# Patient Record
Sex: Female | Born: 1965 | Race: Black or African American | Hispanic: No | Marital: Single | Smoking: Never smoker
Health system: Southern US, Community
[De-identification: ages and names within clinical notes are randomized; demographics above are authoritative.]

## PROBLEM LIST (undated history)

## (undated) DIAGNOSIS — E119 Type 2 diabetes mellitus without complications: Secondary | ICD-10-CM

## (undated) DIAGNOSIS — N809 Endometriosis, unspecified: Secondary | ICD-10-CM

## (undated) DIAGNOSIS — I10 Essential (primary) hypertension: Secondary | ICD-10-CM

## (undated) DIAGNOSIS — F32A Depression, unspecified: Secondary | ICD-10-CM

## (undated) DIAGNOSIS — F419 Anxiety disorder, unspecified: Secondary | ICD-10-CM

## (undated) DIAGNOSIS — Z8719 Personal history of other diseases of the digestive system: Secondary | ICD-10-CM

## (undated) DIAGNOSIS — K219 Gastro-esophageal reflux disease without esophagitis: Secondary | ICD-10-CM

---

## 2012-12-01 ENCOUNTER — Other Ambulatory Visit: Payer: Self-pay

## 2012-12-01 DIAGNOSIS — Z1231 Encounter for screening mammogram for malignant neoplasm of breast: Secondary | ICD-10-CM

## 2013-01-12 ENCOUNTER — Ambulatory Visit
Admission: RE | Admit: 2013-01-12 | Discharge: 2013-01-12 | Disposition: A | Discharge status: Home / Self Care | Payer: Medicaid Other | Source: Ambulatory Visit

## 2013-01-12 DIAGNOSIS — Z1231 Encounter for screening mammogram for malignant neoplasm of breast: Secondary | ICD-10-CM

## 2014-02-01 ENCOUNTER — Other Ambulatory Visit: Payer: Self-pay | Admitting: Family Medicine

## 2014-02-01 DIAGNOSIS — Z1231 Encounter for screening mammogram for malignant neoplasm of breast: Secondary | ICD-10-CM

## 2014-02-12 ENCOUNTER — Encounter (INDEPENDENT_AMBULATORY_CARE_PROVIDER_SITE_OTHER): Payer: Self-pay

## 2014-02-12 ENCOUNTER — Ambulatory Visit
Admission: RE | Admit: 2014-02-12 | Discharge: 2014-02-12 | Disposition: A | Discharge status: Home / Self Care | Payer: Medicaid Other | Source: Ambulatory Visit | Attending: Family Medicine | Admitting: Family Medicine

## 2014-02-12 DIAGNOSIS — Z1231 Encounter for screening mammogram for malignant neoplasm of breast: Secondary | ICD-10-CM

## 2014-03-24 ENCOUNTER — Emergency Department (INDEPENDENT_AMBULATORY_CARE_PROVIDER_SITE_OTHER)
Admission: EM | Admit: 2014-03-24 | Discharge: 2014-03-24 | Disposition: A | Discharge status: Home / Self Care | Payer: Medicaid Other | Source: Home / Self Care | Attending: Family Medicine | Admitting: Family Medicine

## 2014-03-24 ENCOUNTER — Encounter (HOSPITAL_COMMUNITY): Payer: Self-pay | Admitting: Emergency Medicine

## 2014-03-24 DIAGNOSIS — M7500 Adhesive capsulitis of unspecified shoulder: Secondary | ICD-10-CM

## 2014-03-24 DIAGNOSIS — E11618 Type 2 diabetes mellitus with other diabetic arthropathy: Secondary | ICD-10-CM

## 2014-03-24 DIAGNOSIS — E1169 Type 2 diabetes mellitus with other specified complication: Secondary | ICD-10-CM

## 2014-03-24 MED ORDER — PREDNISONE 5 MG PO KIT: PACK | ORAL | Status: DC

## 2014-03-24 NOTE — Discharge Instructions (Signed)
Thank you for coming in today. Take prednisone as directed for 12 days Monitor your blood sugar as prednisone will make it higher than normal.  Followup with Dr. Q[place]man Livings sports medicine.  Work on range of motion exercises that we discussed.   Adhesive Capsulitis Sometimes the shoulder becomes stiff and is painful to move. Some people say it feels as if the shoulder is frozen in place. Because of this, the condition is called "frozen shoulder." Its medical name is adhesive capsulitis.  The shoulder joint is made up of strong connective tissue that attaches the ball of the humerus to the shallow shoulder socket. This strong connective tissue is called the joint capsule. This tissue can become stiff and swollen. That is when adhesive capsulitis sets in. CAUSES  It is not always clear just what the cause adhesive capsulitis. Possibilities include:  Injury to the shoulder joint.  Strain. This is a repetitive injury brought about by overuse.  Lack of use. Perhaps your arm or hand was otherwise injured. It might have been in a sling for awhile. Or perhaps you were not using it to avoid pain.  Referred pain. This is a sort of trick the body plays. You feel pain in the shoulder. But, the pain actually comes from an injury somewhere else in the body.  Long-standing health problems. Several diseases can cause adhesive capsulitis. They include diabetes, heart disease, stroke, thyroid problems, rheumatoid arthritis and lung disease.  Being a women older than 40. Anyone can develop adhesive capsulitis but it is most common in women in this age group. SYMPTOMS   Pain.  It occurs when the arm is moved.  Parts of the shoulder might hurt if they are touched.  Pain is worse at night or when resting.  Soreness. It might not be strong enough to be called pain. But, the shoulder aches.  The shoulder does not move freely.  Muscle spasms.  Trouble sleeping because of shoulder ache or  pain. DIAGNOSIS  To decide if you have adhesive capsulitis, your healthcare provider will probably:  Ask about symptoms you have noticed.  Ask about your history of joint pain and anything that might have caused the pain.  Ask about your overall health.  Use hands to feel your shoulder and neck.  Ask you to move your shoulder in specific directions. This may indicate the origin of the pain.  Order imaging tests; pictures of the shoulder. They help pinpoint the source of the problem. An X-ray might be used. For more detail, an MRI is often used. An MRI details the tendons, muscles and ligaments as well as the joint. TREATMENT  Adhesive capsulitis can be treated several ways. Most treatments can be done in a clinic or in your healthcare provider's office. Be sure to discuss the different options with your caregiver. They include:  Physical therapy. You will work on specific exercises to get your shoulder moving again. The exercises usually involve stretching. A physical therapist (a caregiver with special training) can show you what to do and what not to do. The exercises will need to be done daily.  Medication.  Over-the-counter medicines may relieve pain and inflammation (the body's way of reacting to injury or infection).  Corticosteroids. These are stronger drugs to reduce pain and inflammation. They are given by injection (shots) into the shoulder joint. Frequent treatment is not recommended.  Muscle relaxants. Medication may be prescribed to ease muscle spasms.  Treatment of underlying conditions. This means treating another condition that  is causing your shoulder problem. This might be a rotator cuff (tendon) problem  Shoulder manipulation. The shoulder will be moved by your healthcare provider. You would be under general anesthesia (given a drug that puts you to sleep). You would not feel anything. Sometimes the joint will be injected with salt water (saline) at high pressure to  break down internal scarring in the joint capsule.  Surgery. This is rarely needed. It may be suggested in advanced cases after all other treatment has failed. PROGNOSIS  In time, most people recover from adhesive capsulitis. Sometimes, however, the pain goes away but full movement of the shoulder does not return.  HOME CARE INSTRUCTIONS   Take any pain medications recommended by your healthcare provider. Follow the directions carefully.  If you have physical therapy, follow through with the therapist's suggestions. Be sure you understand the exercises you will be doing. You should understand:  How often the exercises should be done.  How many times each exercise should be repeated.  How long they should be done.  What other activities you should do, or not do.  That you should warm up before doing any exercise. Just 5 to 10 minutes will help. Small, gentle movements should get your shoulder ready for more.  Avoid high-demand exercise that involves your shoulder such as throwing. This type of exercise can make pain worse.  Consider using cold packs. Cold may ease swelling and pain. Ask your healthcare provider if a cold pack might help you. If so, get directions on how and when to use them. SEEK MEDICAL CARE IF:   You have any questions about your medications.  Your pain continues to increase. Document Released: 04/18/2009 Document Revised: 09/13/2011 Document Reviewed: 04/18/2009 [facility] Patient Information 2015 [place], [place]. This information is not intended to replace advice given to you by your health care provider. Make sure you discuss any questions you have with your health care provider.

## 2014-03-24 NOTE — ED Notes (Signed)
Patient complains of right shoulder pain Denies any injury Woke up with the pain three days ago

## 2014-03-24 NOTE — ED Provider Notes (Signed)
Olivia Campbell is a 48 y.o. female who presents to Urgent Care today for right shoulder pain. Patient is a three-day history of right shoulder pain occurred without injury. The pain is moderate to severe is located in the anterior lateral aspect of the shoulder. The pain is worse with overhand motion and external rotation. Patient notes that she is unable to fully abduct or externally rotate her arm. She's tried ibuprofen at over-the-counter and prescription doses which have not helped. The pain is interfering with sleep. She has a medical history significant for diabetes. She is allergic to Novocain.   History reviewed. No pertinent past medical history. History  Substance Use Topics  . Smoking status: Not on file  . Smokeless tobacco: Not on file  . Alcohol Use: Not on file   ROS as above Medications: No current facility-administered medications for this encounter.   Current Outpatient Prescriptions  Medication Sig Dispense Refill  . PredniSONE 5 MG KIT 12 day dosepack po  1 kit  0    Exam:  BP 125/86  Pulse 87  Temp(Src) 98.1 F (36.7 C) (Oral)  Resp 16  SpO2 97%  LMP 12/16/2012 Gen: Well NAD HEENT: ,  MMM Lungs: Normal work of breathing. CTABL Heart: RRR no MRG Exts: Brisk capillary refill, warm and well perfused.  Neck: Nontender to spinal midline normal neck range of motion negative Spurling's test Right shoulder: Normal-appearing nontender. External rotation limited to 10 beyond neutral position. Abduction limited to 70 active and passive Forward flexion limited to 80 active and passive Unable to adequately perform impingement testing due to guarding and limited range of motion. Upper extremity grip strength sensation and capillary Refill and pulses are intact equal bilaterally Reflexes are equal bilateral elbows and brachial radialis  No results found for this or any previous visit (from the past 24 hour(s)). No results found.  Assessment and Plan: 48 y.o.  female with right shoulder pain. Likely early 55s of capsulitis. Suspect either capsulitis instead of bursitis given limited range of motion in more than one plain. Discussed options. Patient is allergic to lidocaine therefore feel that course of steroid injection is not a great idea at this time. We discussed options and plan for a plan of oral prednisone with gentle home range of motion. Followup with sports medicine.  Discussed warning signs or symptoms. Please see discharge instructions. Patient expresses understanding.     Gregor Hams, MD 03/24/14 1055

## 2016-01-20 ENCOUNTER — Other Ambulatory Visit: Payer: Self-pay | Admitting: Family Medicine

## 2016-01-20 DIAGNOSIS — Z1231 Encounter for screening mammogram for malignant neoplasm of breast: Secondary | ICD-10-CM

## 2016-01-29 ENCOUNTER — Ambulatory Visit
Admission: RE | Admit: 2016-01-29 | Discharge: 2016-01-29 | Disposition: A | Discharge status: Home / Self Care | Payer: Medicaid Other | Source: Ambulatory Visit | Attending: Family Medicine | Admitting: Family Medicine

## 2016-01-29 DIAGNOSIS — Z1231 Encounter for screening mammogram for malignant neoplasm of breast: Secondary | ICD-10-CM

## 2016-01-29 IMAGING — MG DIGITAL SCREENING BILATERAL MAMMOGRAM WITH CAD
8 series · 8 of 8 positions shown · non-contrast
Comparison: Previous exam(s).

CLINICAL DATA: Screening.

EXAM:
DIGITAL SCREENING BILATERAL MAMMOGRAM WITH CAD

[L CC (1 of 2)]
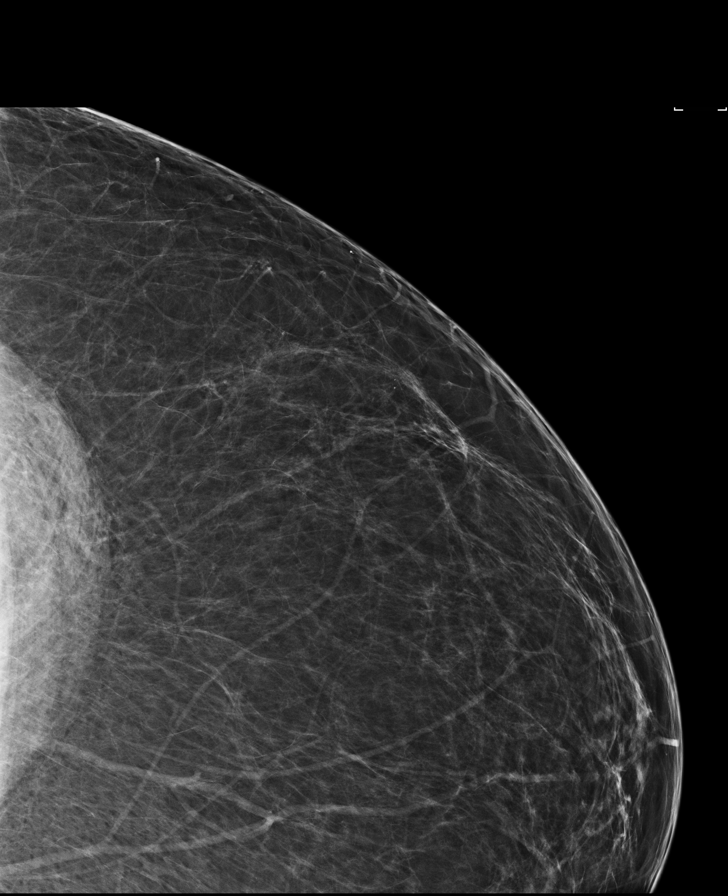

[L MLO (1 of 2)]
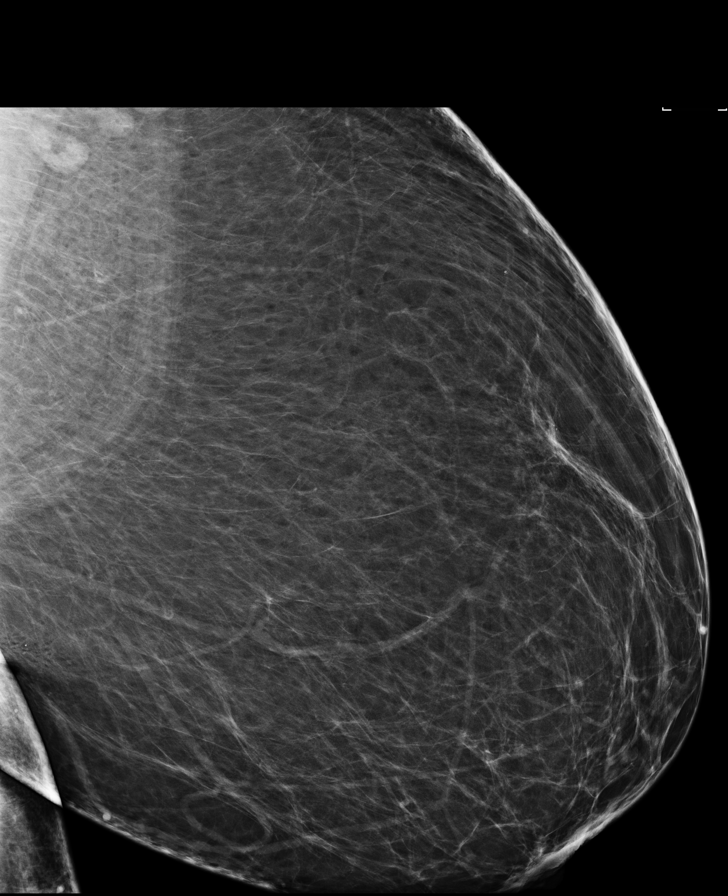

[L MLO (2 of 2)]
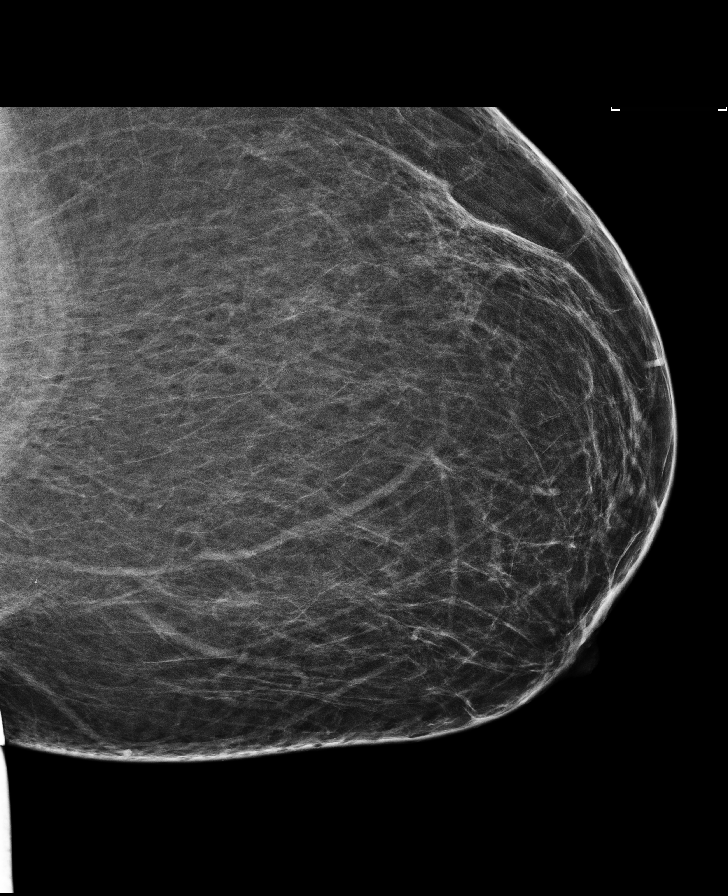

[L CC (2 of 2)]
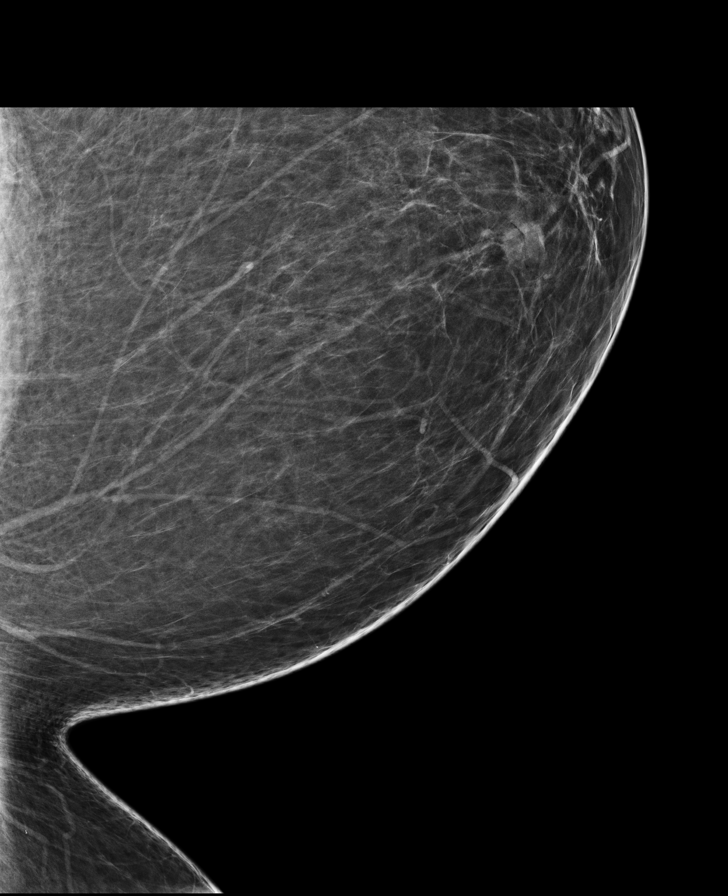

[R CC (1 of 2)]
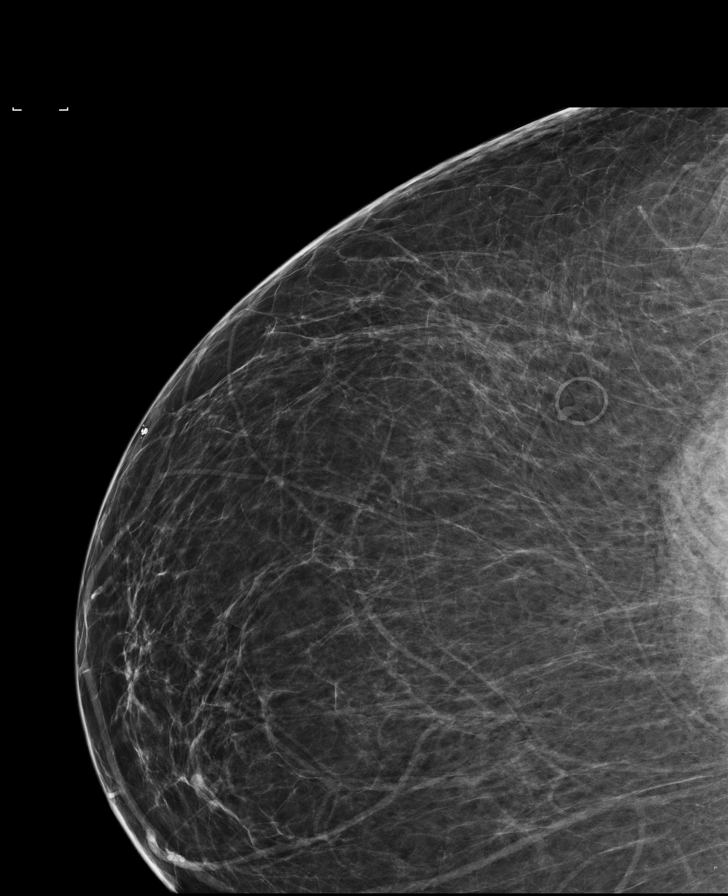

[R MLO (1 of 2)]
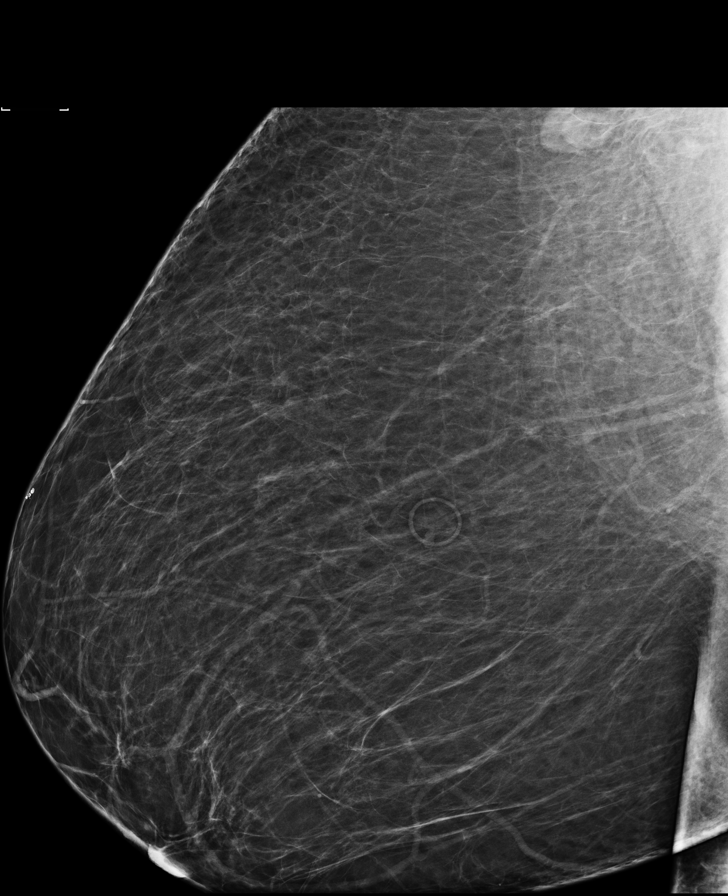

[R CC (2 of 2)]
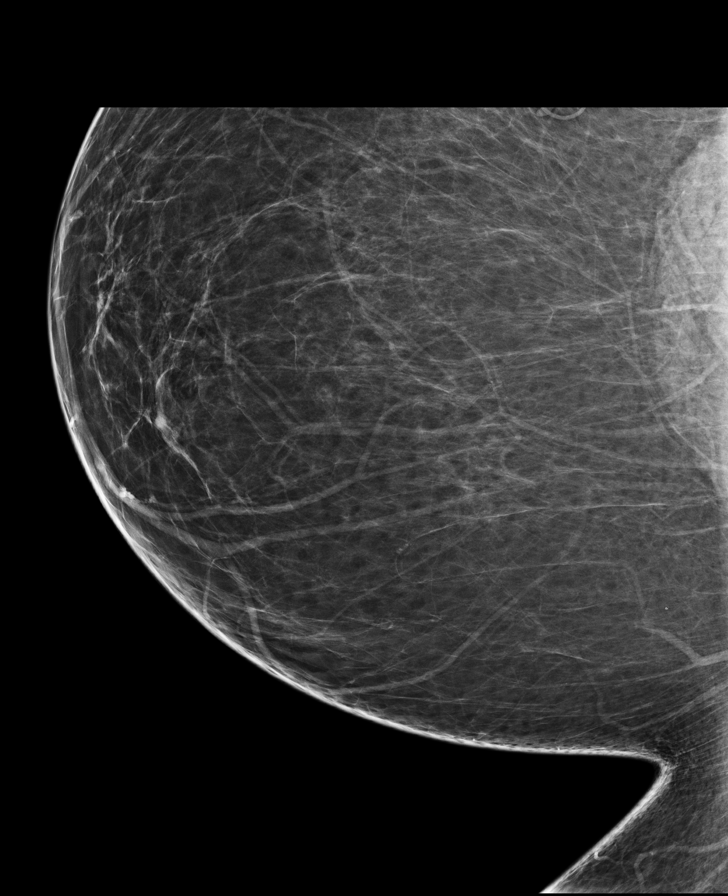

[R MLO (2 of 2)]
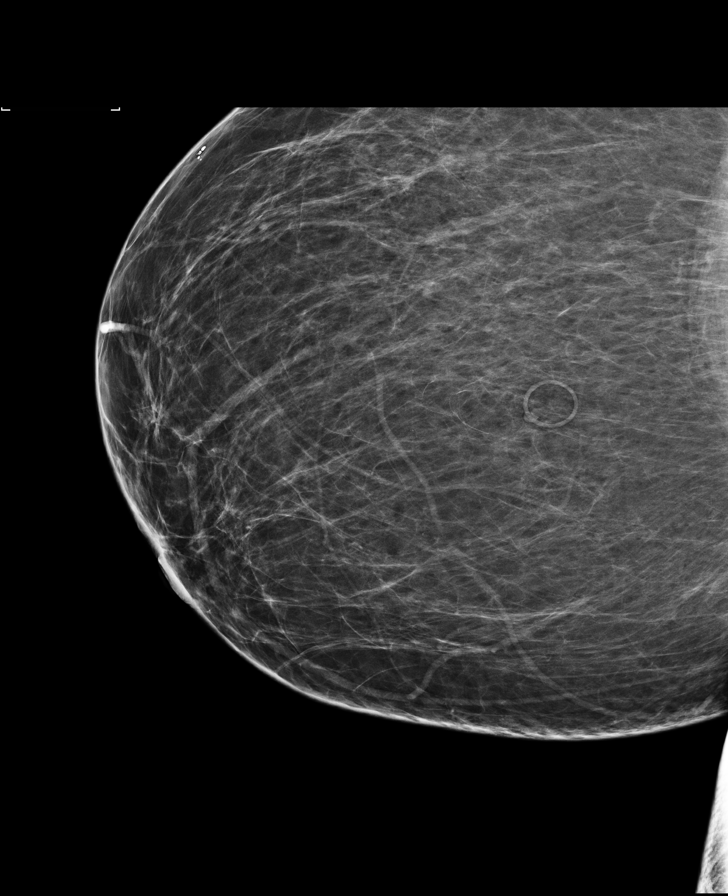

[8 of 8 positions shown; findings below may reference images not displayed]

ACR Breast Density Category b: There are scattered areas of
fibroglandular density.
FINDINGS: There are no findings suspicious for malignancy. Images were
processed with CAD.
IMPRESSION: No mammographic evidence of malignancy. A result letter of this
screening mammogram will be mailed directly to the patient.

RECOMMENDATION:
Screening mammogram in one year. (Code:[US])

BI-RADS CATEGORY  1: Negative.

## 2016-11-27 ENCOUNTER — Emergency Department (HOSPITAL_BASED_OUTPATIENT_CLINIC_OR_DEPARTMENT_OTHER)
Admission: EM | Admit: 2016-11-27 | Discharge: 2016-11-28 | Disposition: A | Discharge status: Home / Self Care | Payer: Medicaid Other | Attending: Emergency Medicine | Admitting: Emergency Medicine

## 2016-11-27 ENCOUNTER — Encounter (HOSPITAL_BASED_OUTPATIENT_CLINIC_OR_DEPARTMENT_OTHER): Payer: Self-pay | Admitting: Emergency Medicine

## 2016-11-27 ENCOUNTER — Emergency Department (HOSPITAL_BASED_OUTPATIENT_CLINIC_OR_DEPARTMENT_OTHER): Payer: Medicaid Other

## 2016-11-27 DIAGNOSIS — T783XXA Angioneurotic edema, initial encounter: Secondary | ICD-10-CM | POA: Insufficient documentation

## 2016-11-27 DIAGNOSIS — E119 Type 2 diabetes mellitus without complications: Secondary | ICD-10-CM | POA: Insufficient documentation

## 2016-11-27 DIAGNOSIS — I10 Essential (primary) hypertension: Secondary | ICD-10-CM | POA: Insufficient documentation

## 2016-11-27 DIAGNOSIS — Z79899 Other long term (current) drug therapy: Secondary | ICD-10-CM | POA: Diagnosis not present

## 2016-11-27 DIAGNOSIS — Z7984 Long term (current) use of oral hypoglycemic drugs: Secondary | ICD-10-CM | POA: Diagnosis not present

## 2016-11-27 DIAGNOSIS — T7840XA Allergy, unspecified, initial encounter: Secondary | ICD-10-CM | POA: Diagnosis present

## 2016-11-27 HISTORY — DX: Type 2 diabetes mellitus without complications: E11.9

## 2016-11-27 HISTORY — DX: Essential (primary) hypertension: I10

## 2016-11-27 IMAGING — CT CT NECK W/O CM
3 of 9 series · 7 of 33 positions shown, 8 images · non-contrast
Comparison: None.

CLINICAL DATA: Tongue and pharyngeal swelling for 9 hours,
dysphagia. Assess for parotiditis. History of hypertension and
diabetes.

EXAM:
CT NECK WITHOUT CONTRAST
TECHNIQUE: Multidetector CT imaging of the neck was performed following the
standard protocol without intravenous contrast.

[Series 3: axial neck · axial · 0.47mm/px · z∈[-199,-127]mm · 2 of 110 slices shown]
[im 37/110  bone]
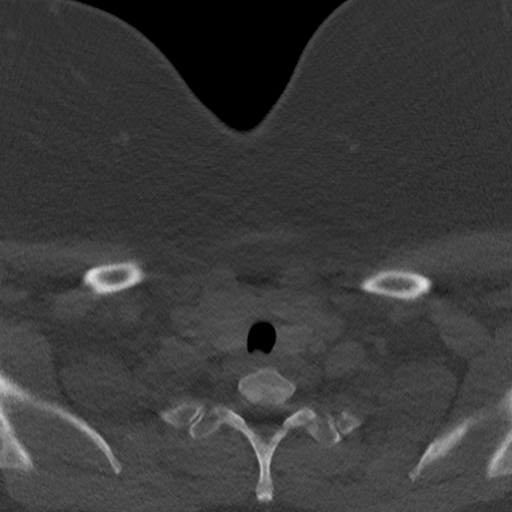
[im 73/110  bone]
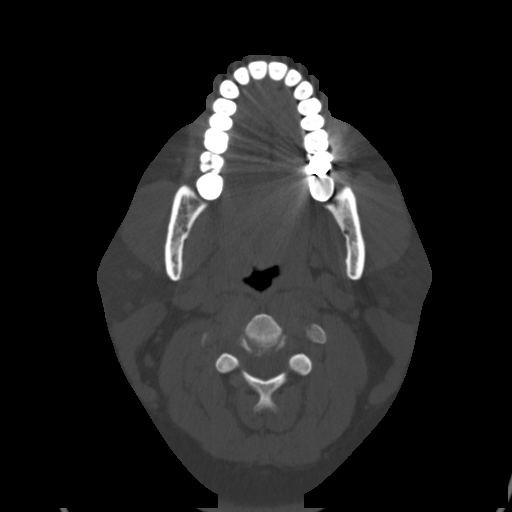

[Series 6: cor neck · coronal · 0.47mm/px · 1 of 115 slices shown]
[im 58/115  bone]
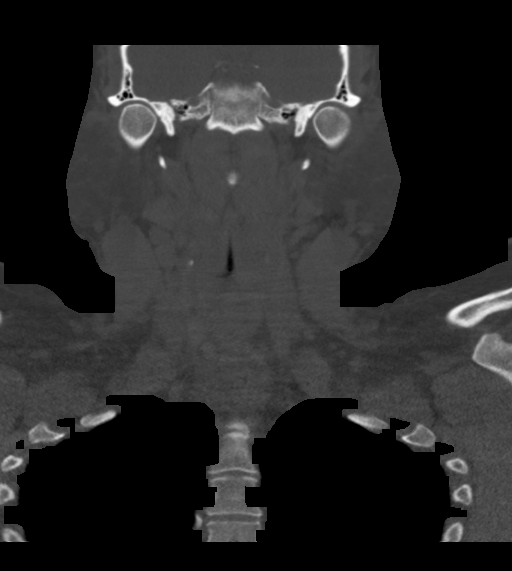

[Series 7: orthogonal ax · axial · 0.39mm/px · z∈[-271,-116]mm · 4 of 138 slices shown, 5 images]
[im 28/138  soft-tissue]
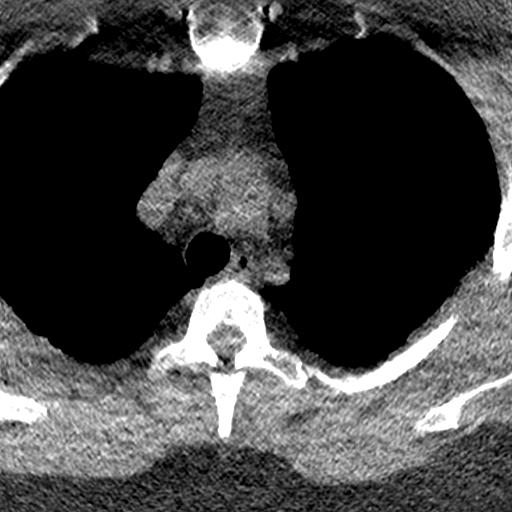
[im 28/138  bone]
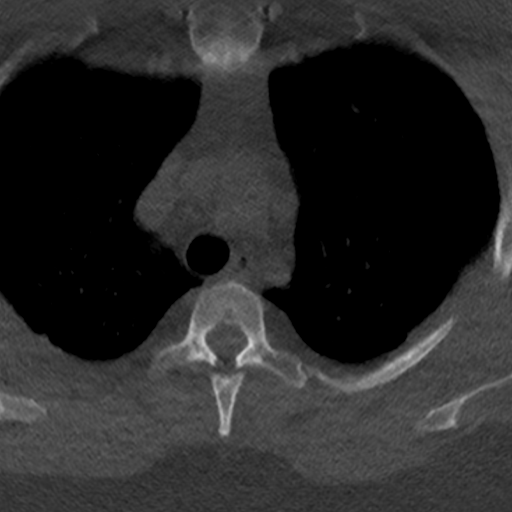
[im 55/138  bone]
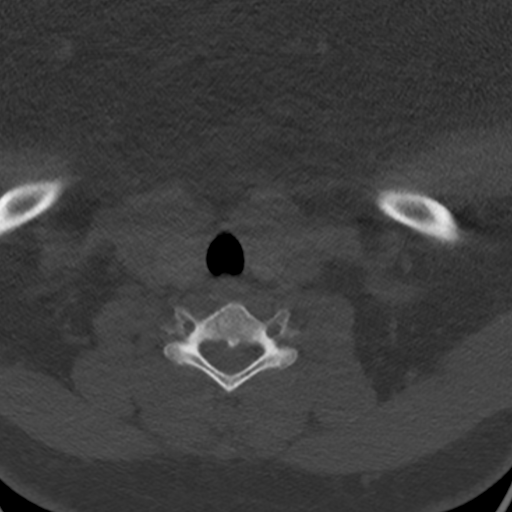
[im 83/138  bone]
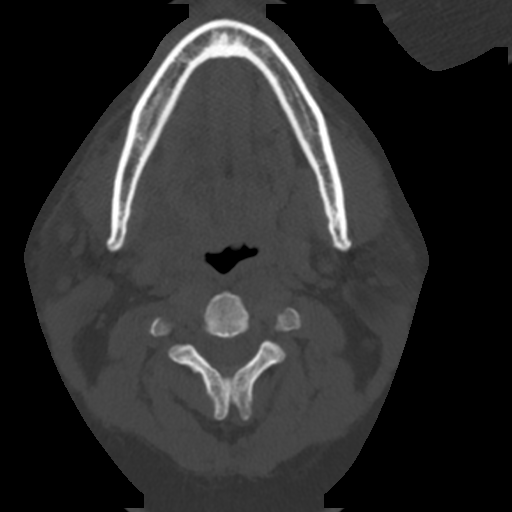
[im 110/138  bone]
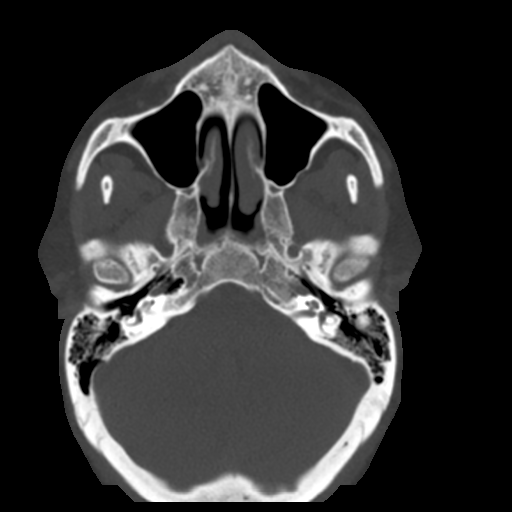

[7 of 33 positions shown; findings below may reference images not displayed]

FINDINGS: Large body habitus results in overall noisy image quality.

PHARYNX AND LARYNX: RIGHT lateral pharyngeal space fat stranding and
effacement. Widely patent airway.

SALIVARY GLANDS: Fat stranding within RIGHT submandibular space,
trace effusion. No sialolith. Normal noncontrast CT appearance of
parotid glands. Subcentimeter bilateral parotid gland lymph nodes
are likely reactive.

THYROID: Normal.

LYMPH NODES: No lymphadenopathy by CT size criteria though limited
assessment without contrast.

VASCULAR: Normal.

LIMITED INTRACRANIAL: Normal.

VISUALIZED ORBITS: Normal.

MASTOIDS AND VISUALIZED PARANASAL SINUSES: Well-aerated.

SKELETON: Nonacute.  No CT findings of dental pathology

UPPER CHEST: Lung apices are clear. No superior mediastinal
lymphadenopathy.

OTHER: None.
IMPRESSION: Limited noncontrast CT neck: Acute suspected RIGHT submandibular
sialoadenitis without sialolith. Patent airway.

## 2016-11-27 MED ORDER — FAMOTIDINE IN NACL 20-0.9 MG/50ML-% IV SOLN
20.0000 mg | Freq: Once | INTRAVENOUS | Status: AC
  Administered 2016-11-27: 20 mg via INTRAVENOUS

## 2016-11-27 MED ORDER — DIPHENHYDRAMINE HCL 50 MG/ML IJ SOLN
INTRAMUSCULAR | Status: AC
  Administered 2016-11-27: 25 mg via INTRAVENOUS
  Filled 2016-11-27: qty 1

## 2016-11-27 MED ORDER — DIPHENHYDRAMINE HCL 50 MG/ML IJ SOLN
25.0000 mg | Freq: Once | INTRAMUSCULAR | Status: AC
  Administered 2016-11-27: 25 mg via INTRAVENOUS

## 2016-11-27 MED ORDER — PREDNISONE 20 MG PO TABS: 40.0000 mg | ORAL_TABLET | Freq: Every day | ORAL | 0 refills | Status: DC

## 2016-11-27 MED ORDER — FAMOTIDINE IN NACL 20-0.9 MG/50ML-% IV SOLN
INTRAVENOUS | Status: AC
  Administered 2016-11-27: 20 mg via INTRAVENOUS
  Filled 2016-11-27: qty 50

## 2016-11-27 MED ORDER — METHYLPREDNISOLONE SODIUM SUCC 125 MG IJ SOLR
INTRAMUSCULAR | Status: AC
  Administered 2016-11-27: 125 mg via INTRAVENOUS
  Filled 2016-11-27: qty 2

## 2016-11-27 MED ORDER — METHYLPREDNISOLONE SODIUM SUCC 125 MG IJ SOLR
125.0000 mg | Freq: Once | INTRAMUSCULAR | Status: AC
  Administered 2016-11-27: 125 mg via INTRAVENOUS

## 2016-11-27 NOTE — ED Notes (Signed)
Pt has swelling to tongue and posterior pharynx that started around 15:00 today. Swelling has progressively worsened. Pt reports difficulty swallowing, and her throat "feels like it has rocks in it." Pt denies any difficulty with breathing at this time. Pt takes lisinopril.

## 2016-11-27 NOTE — ED Provider Notes (Signed)
[facility] Provider Note   CSN: [id number] Arrival date & time: 11/27/16  2134 By signing my name below, I, Fabian Sharp, attest that this documentation has been prepared under the direction and in the presence of Blanchie Dessert, MD . Electronically Signed: Fabian Sharp, ED Scribe. 11/27/2016. 10:10 PM.  History   Chief Complaint Chief Complaint  Patient presents with  . Allergic Reaction  HPI Comments:  Olivia Campbell is a 51 y.o. female, with a history of DM and HTN on Lisinopril, who presents to the Emergency Department complaining of sudden onset, progressively worsening swelling to her tongue and posterior pharynx onset today at 3 pm. Pt reports associated right sided facial numbness and difficulty swallowing. Per pt, her symptoms worsened significantly tonight at 6 pm after eating, and have continued to intensify. No OTC treatments tried for these symptoms PTA.  Pt's current symptoms do not feel like prior allergic reaction to shellfish.  Pt denies shortness of breath, rash, itching, or sore throat.   The history is provided by the patient. No language interpreter was used.   Past Medical History:  Diagnosis Date  . Diabetes mellitus without complication ([place])   . Hypertension     There are no active problems to display for this patient.   History reviewed. No pertinent surgical history.  OB History    No data available       Home Medications    Prior to Admission medications   Medication Sig Start Date End Date Taking? Authorizing Provider  buPROPion (WELLBUTRIN) 100 MG tablet Take 100 mg by mouth 2 (two) times daily.   Yes [provider]  esomeprazole (NEXIUM) 20 MG capsule Take 20 mg by mouth daily at 12 noon.   Yes [provider]  FLUoxetine (PROZAC) 20 MG tablet Take 20 mg by mouth daily.   Yes [provider]  lisinopril (PRINIVIL,ZESTRIL) 10 MG tablet Take 10 mg by mouth daily.   Yes [provider]    sitaGLIPtin-metformin (JANUMET) 50-1000 MG tablet Take 1 tablet by mouth 2 (two) times daily with a meal.   Yes [provider]  PredniSONE 5 MG KIT 12 day dosepack po 03/24/14   Gregor Hams, MD    Family History No family history on file.  Social History Social History  Substance Use Topics  . Smoking status: Not on file  . Smokeless tobacco: Not on file  . Alcohol use Not on file     Allergies   Novocain [procaine]   Review of Systems Review of Systems All systems reviewed and are negative for acute change except as noted in the HPI.  Physical Exam Updated Vital Signs BP 124/80 (BP Location: Right Arm)   Pulse 74   Temp 98.6 F (37 C) (Oral)   Resp 20   LMP 12/16/2012   SpO2 100%   Physical Exam  Constitutional: She appears well-developed and well-nourished. No distress.  HENT:  Head: Normocephalic and atraumatic.  Fullness and swelling in right parotid and angle of the jaw. Minimal tenderness, no swelling of the uvula. Minimal right sided tongue swelling. No voice changes.  Eyes: Conjunctivae are normal.  Neck: Normal range of motion. No tracheal deviation present.  No bruits  Cardiovascular: Normal rate, regular rhythm and normal heart sounds.   Pulmonary/Chest: Effort normal and breath sounds normal. No stridor. No respiratory distress. She has no wheezes. She has no rales.  Abdominal: She exhibits no distension.  Musculoskeletal: Normal range of motion.  Neurological: She is alert.  Skin: No pallor.  Psychiatric: She has a normal mood and affect. Her behavior is normal.  Nursing note and vitals reviewed.  ED Treatments / Results  DIAGNOSTIC STUDIES:  Oxygen Saturation is 100% on RA, normal by my interpretation.    COORDINATION OF CARE:  10:14 PM Will order benadryl, solumedrol and Pepcid.  Discussed treatment plan with pt at bedside and pt agreed to plan.  Labs (all labs ordered are listed, but only abnormal results are displayed) Labs  Reviewed - No data to display  EKG  EKG Interpretation None       Radiology Ct Soft Tissue Neck Wo Contrast  Result Date: 11/27/2016 CLINICAL DATA:  Tongue and pharyngeal swelling for 9 hours, dysphagia. Assess for parotiditis. History of hypertension and diabetes. EXAM: CT NECK WITHOUT CONTRAST TECHNIQUE: Multidetector CT imaging of the neck was performed following the standard protocol without intravenous contrast. COMPARISON:  None. FINDINGS: Large body habitus results in overall noisy image quality. PHARYNX AND LARYNX: RIGHT lateral pharyngeal space fat stranding and effacement. Widely patent airway. SALIVARY GLANDS: Fat stranding within RIGHT submandibular space, trace effusion. No sialolith. Normal noncontrast CT appearance of parotid glands. Subcentimeter bilateral parotid gland lymph nodes are likely reactive. THYROID: Normal. LYMPH NODES: No lymphadenopathy by CT size criteria though limited assessment without contrast. VASCULAR: Normal. LIMITED INTRACRANIAL: Normal. VISUALIZED ORBITS: Normal. MASTOIDS AND VISUALIZED PARANASAL SINUSES: Well-aerated. SKELETON: Nonacute.  No CT findings of dental pathology UPPER CHEST: Lung apices are clear. No superior mediastinal lymphadenopathy. OTHER: None. IMPRESSION: Limited noncontrast CT neck: Acute suspected RIGHT submandibular sialoadenitis without sialolith. Patent airway. Electronically Signed   By: Elon Alas M.D.   On: 11/27/2016 23:46    Procedures Procedures (including critical care time)  Medications Ordered in ED Medications  methylPREDNISolone sodium succinate (SOLU-MEDROL) 125 mg/2 mL injection (not administered)  diphenhydrAMINE (BENADRYL) 50 MG/ML injection (not administered)  famotidine (PEPCID) 20-0.9 MG/50ML-% IVPB (not administered)     Initial Impression / Assessment and Plan / ED Course  I have reviewed the triage vital signs and the nursing notes.  Pertinent labs & imaging results that were available during my  care of the patient were reviewed by me and considered in my medical decision making (see chart for details).     Patient with worsening swelling in the right side of the tongue and face with some mild difficulty swallowing. This has been gradually worsening since around 3:00 today. Patient is in no acute distress and is tolerating her own secretions but she feels that is difficult to swallow. She denies any breathing difficulty. Minimal swelling to the right side of the tongue but no uvular swelling or deviation. No tracheal deviation. Patient is on lisinopril and suspect symptoms are related to angioedema however there is some minimal tenderness over the parotid and will do a CT to rule out any other acute process. She denies any infectious symptoms and is otherwise well-appearing.  Patient did receive Benadryl, Decadron and Pepcid. Will continue to monitor the patient  11:56 PM CT with swelling around the submandibular gland but no airway involvement or stones.  On re-eval pt is feeling much better.  Will d/c home.  To stop lisinopril.  Final Clinical Impressions(s) / ED Diagnoses   Final diagnoses:  Angioedema, initial encounter    New Prescriptions New Prescriptions   PREDNISONE (DELTASONE) 20 MG TABLET    Take 2 tablets (40 mg total) by mouth daily.   I personally performed the services described in  this documentation, which was scribed in my presence.  The recorded information has been reviewed and considered.     Blanchie Dessert, MD 11/28/16 0000

## 2016-11-27 NOTE — ED Notes (Signed)
EDP at bedside  

## 2016-11-27 NOTE — ED Triage Notes (Signed)
PT presents to ED with complaints of facial swelling and throat swelling that started around 3 pm today. PT states she took her lisinopril around 9 am

## 2016-11-27 NOTE — ED Notes (Signed)
EDP notified. 

## 2016-11-28 NOTE — ED Notes (Signed)
ED Provider at bedside. 

## 2016-11-28 NOTE — ED Notes (Signed)
Pt reports subjective improvement of oral swelling. Mild swelling still noted of the tongue. Pt provided with strict return instructions if pt notices a worsening of symptoms.

## 2016-12-23 ENCOUNTER — Other Ambulatory Visit: Payer: Self-pay | Admitting: Family Medicine

## 2016-12-23 DIAGNOSIS — Z1231 Encounter for screening mammogram for malignant neoplasm of breast: Secondary | ICD-10-CM

## 2017-02-07 ENCOUNTER — Ambulatory Visit
Admission: RE | Admit: 2017-02-07 | Discharge: 2017-02-07 | Disposition: A | Discharge status: Home / Self Care | Payer: Medicaid Other | Source: Ambulatory Visit | Attending: Family Medicine | Admitting: Family Medicine

## 2017-02-07 DIAGNOSIS — Z1231 Encounter for screening mammogram for malignant neoplasm of breast: Secondary | ICD-10-CM

## 2017-02-07 IMAGING — MG DIGITAL SCREENING BILATERAL MAMMOGRAM WITH CAD
8 of 10 series · 8 of 10 positions shown · non-contrast
Comparison: Previous exam(s).

ACR Breast Density Category a: The breast tissue is almost entirely
fatty.

CLINICAL DATA: Screening.

EXAM:
DIGITAL SCREENING BILATERAL MAMMOGRAM WITH CAD

[R MLO (1 of 2)]
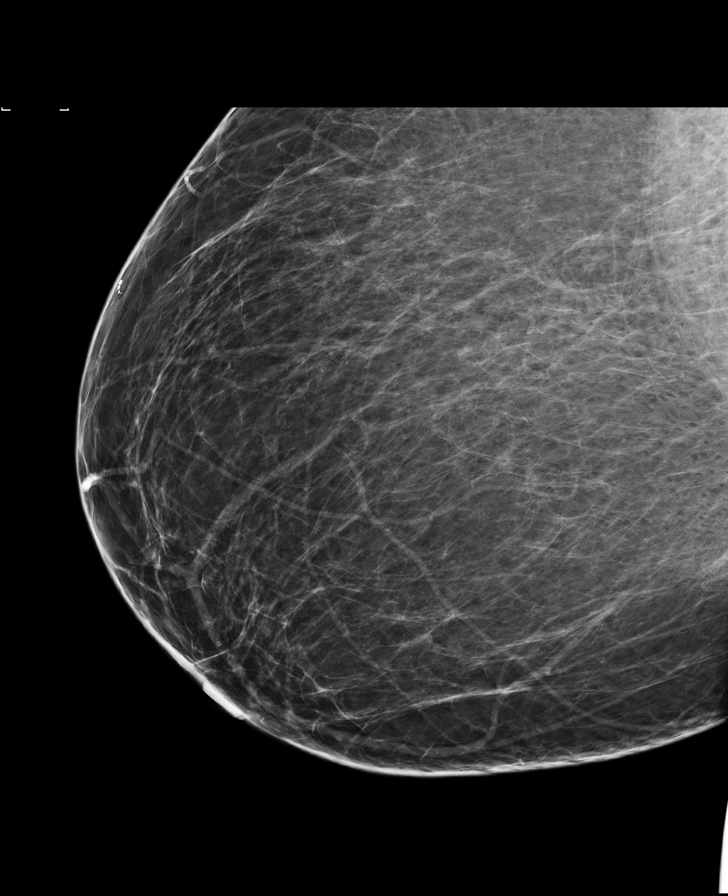

[L CC]
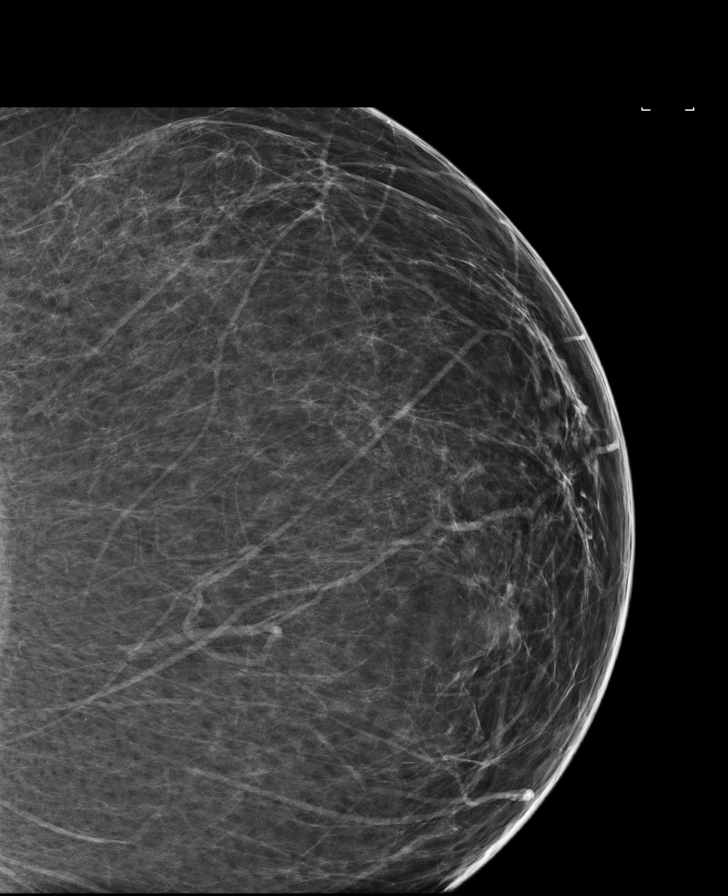

[R CC (1 of 2)]
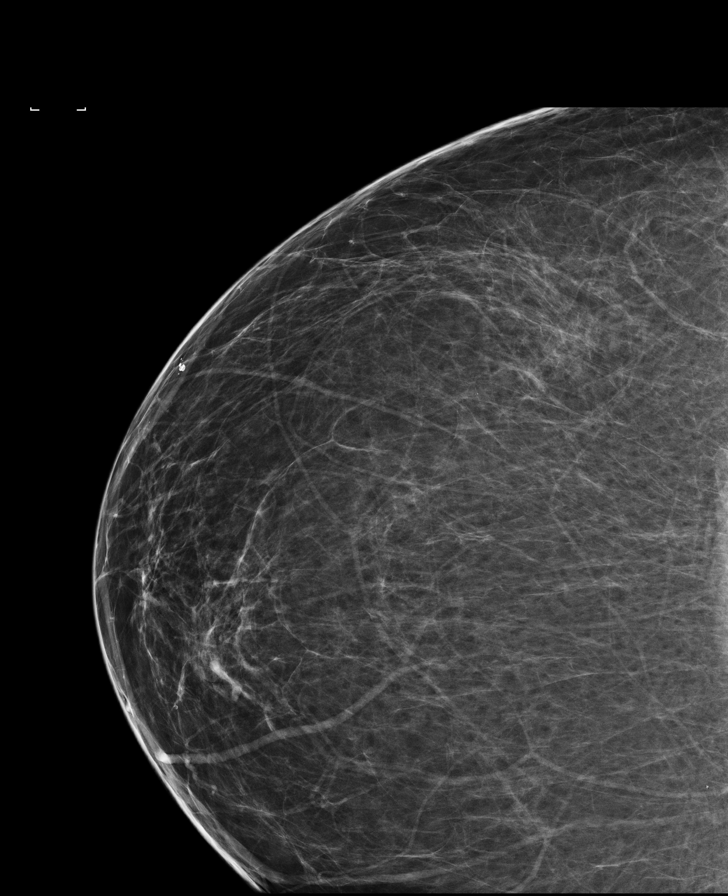

[R CC (2 of 2)]
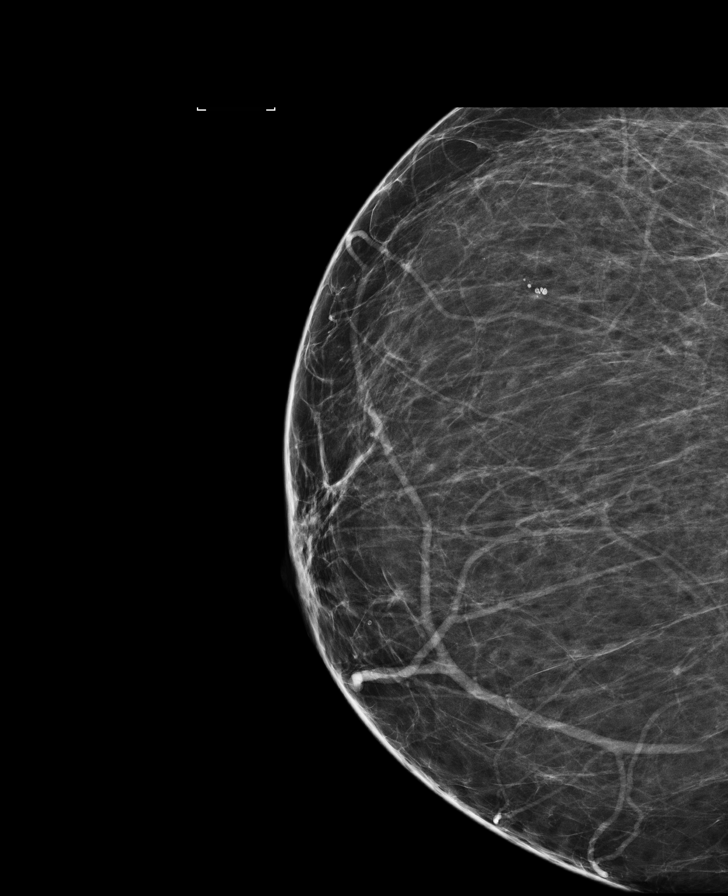

[L CV]
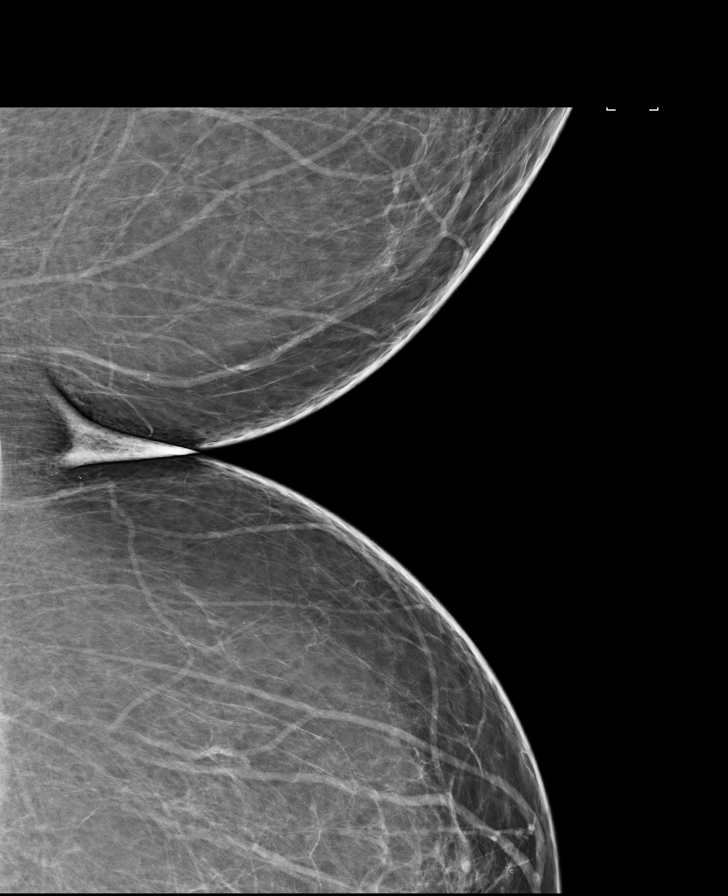

[L MLO (1 of 2)]
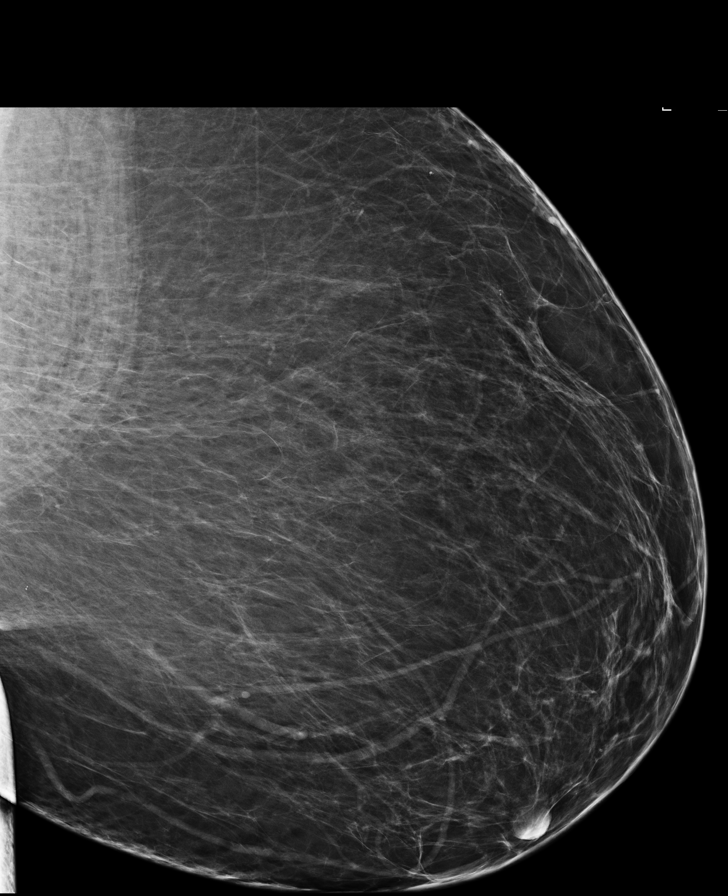

[L MLO (2 of 2)]
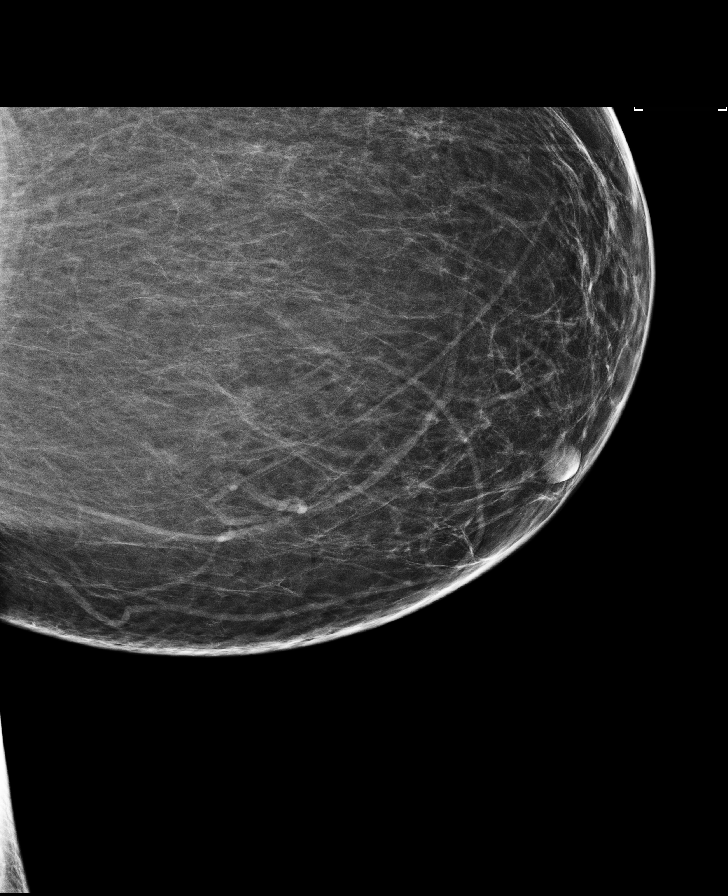

[R MLO (2 of 2)]
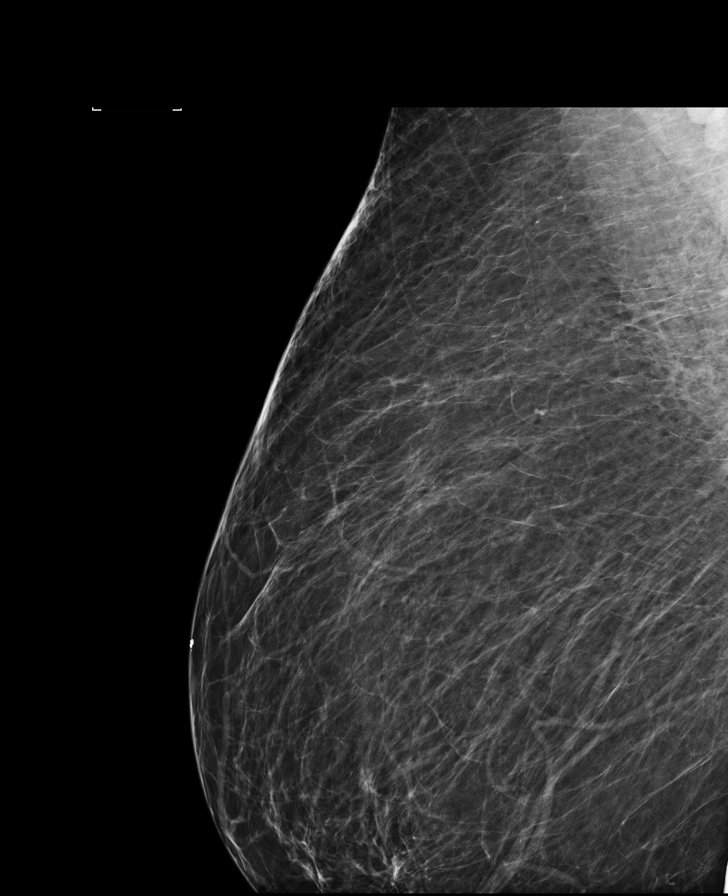

[8 of 10 positions shown; findings below may reference images not displayed]

FINDINGS: There are no findings suspicious for malignancy. Images were
processed with CAD.
IMPRESSION: No mammographic evidence of malignancy. A result letter of this
screening mammogram will be mailed directly to the patient.

RECOMMENDATION:
Screening mammogram in one year. (Code:[0V])

BI-RADS CATEGORY  1: Negative.

## 2018-01-20 ENCOUNTER — Other Ambulatory Visit: Payer: Self-pay | Admitting: Physician Assistant

## 2018-01-20 ENCOUNTER — Other Ambulatory Visit: Payer: Self-pay | Admitting: Obstetrics and Gynecology

## 2018-01-20 DIAGNOSIS — Z1231 Encounter for screening mammogram for malignant neoplasm of breast: Secondary | ICD-10-CM

## 2018-02-15 ENCOUNTER — Ambulatory Visit
Admission: RE | Admit: 2018-02-15 | Discharge: 2018-02-15 | Disposition: A | Discharge status: Home / Self Care | Payer: Medicaid Other | Source: Ambulatory Visit | Attending: Physician Assistant | Admitting: Physician Assistant

## 2018-02-15 DIAGNOSIS — Z1231 Encounter for screening mammogram for malignant neoplasm of breast: Secondary | ICD-10-CM

## 2018-02-15 IMAGING — MG DIGITAL SCREENING BILATERAL MAMMOGRAM WITH TOMO AND CAD
6 of 12 series · 6 of 36 positions shown · non-contrast
Comparison: Previous exam(s).

CLINICAL DATA: Screening.

EXAM:
DIGITAL SCREENING BILATERAL MAMMOGRAM WITH TOMO AND CAD

[L CC synth-2D (1 of 2)]
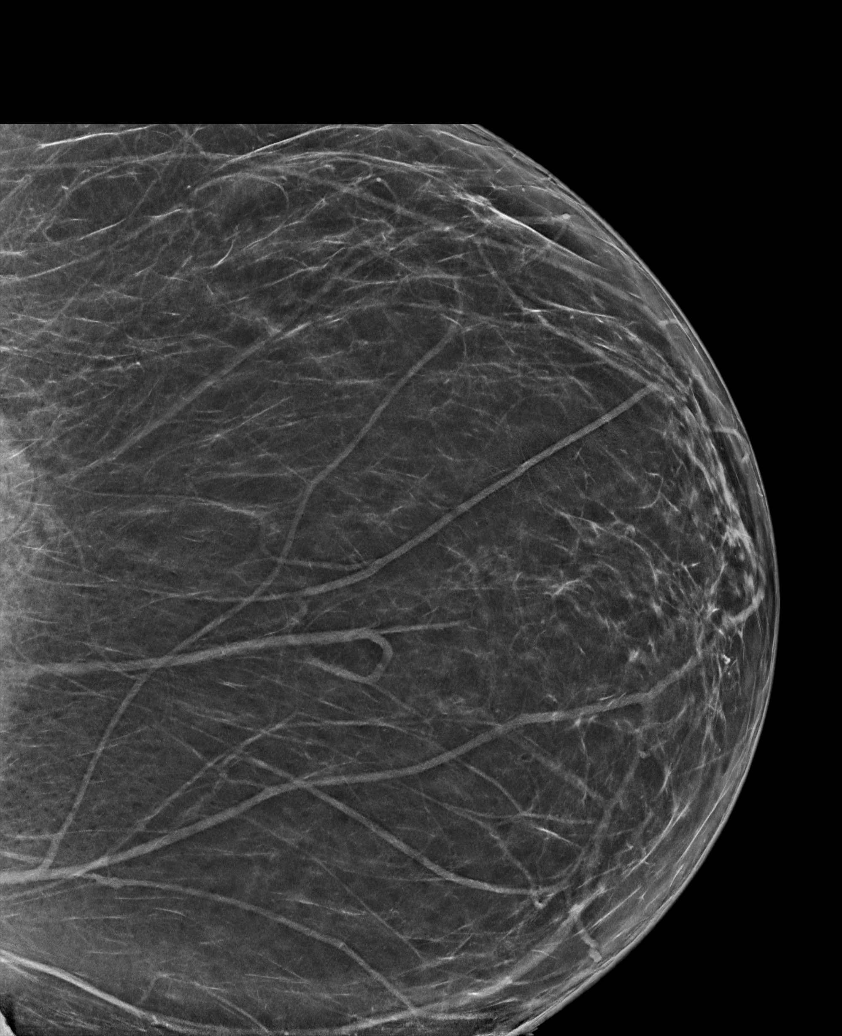

[R MLO synth-2D]
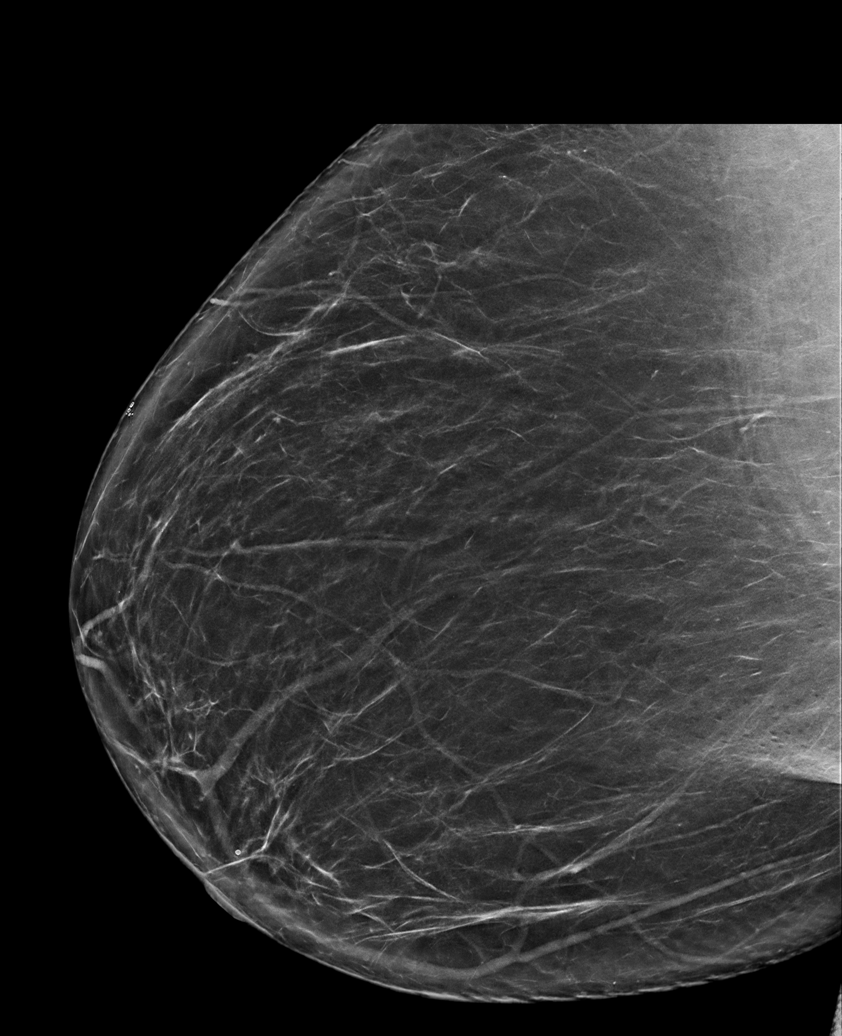

[L CC synth-2D (2 of 2)]
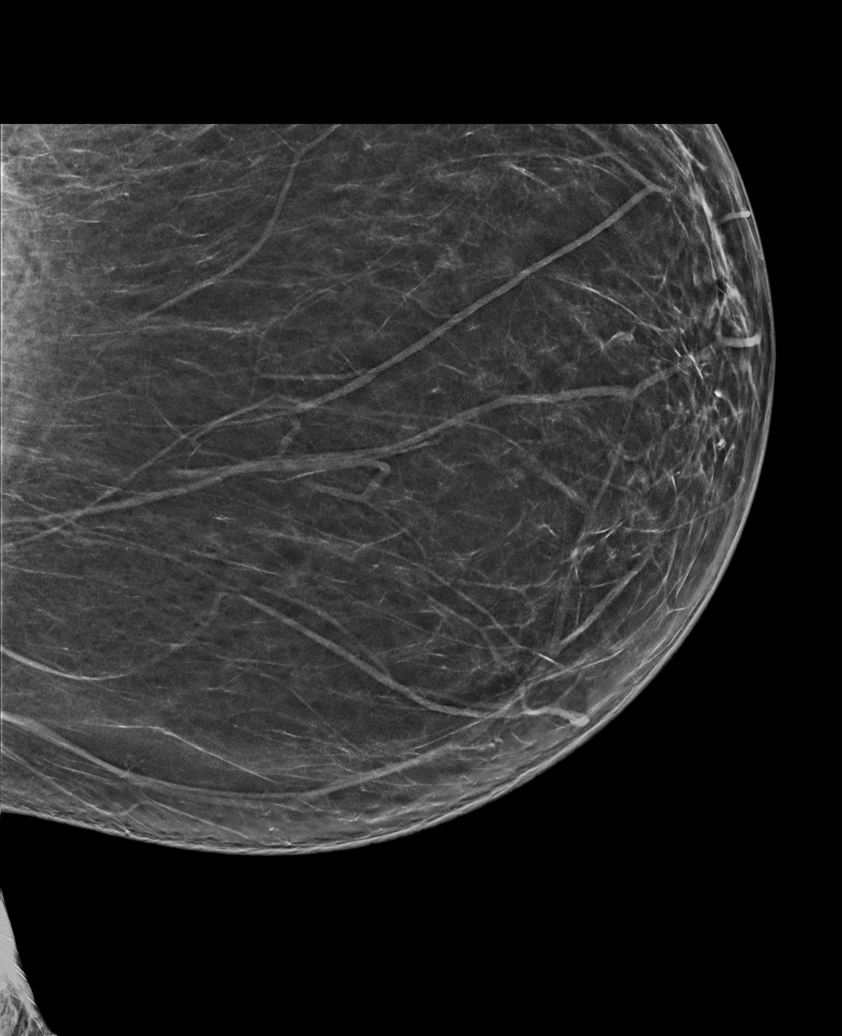

[R CC synth-2D (1 of 2)]
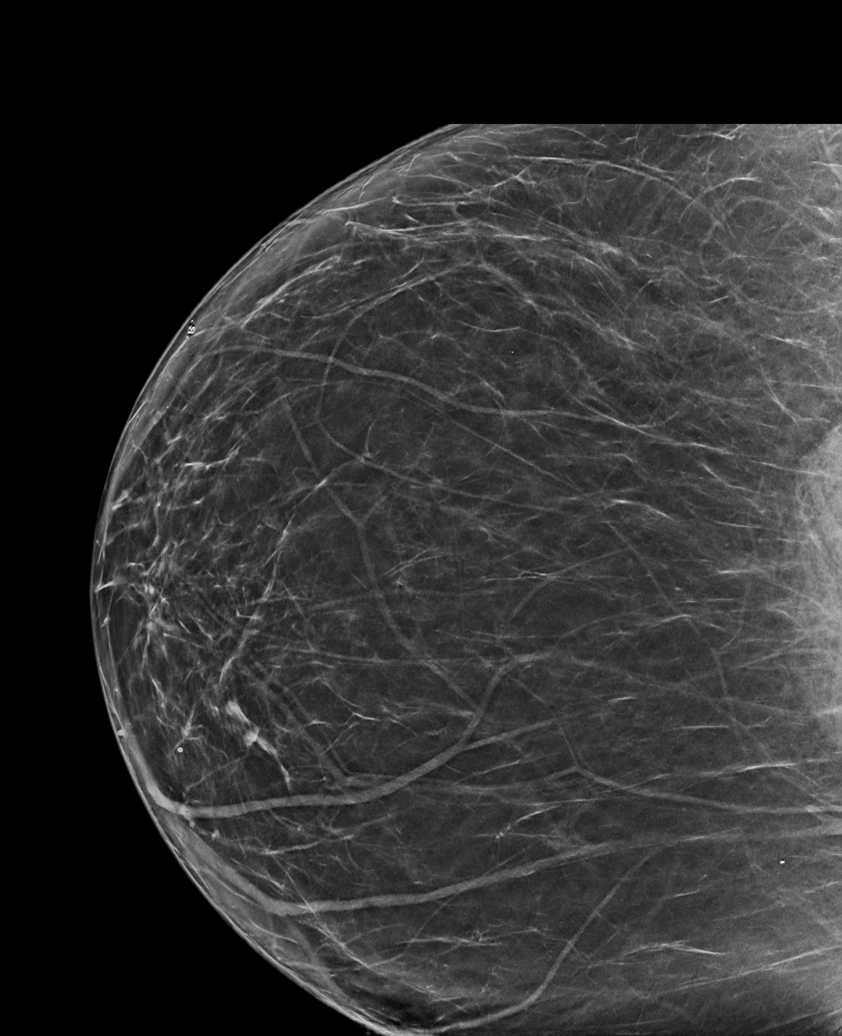

[R CC synth-2D (2 of 2)]
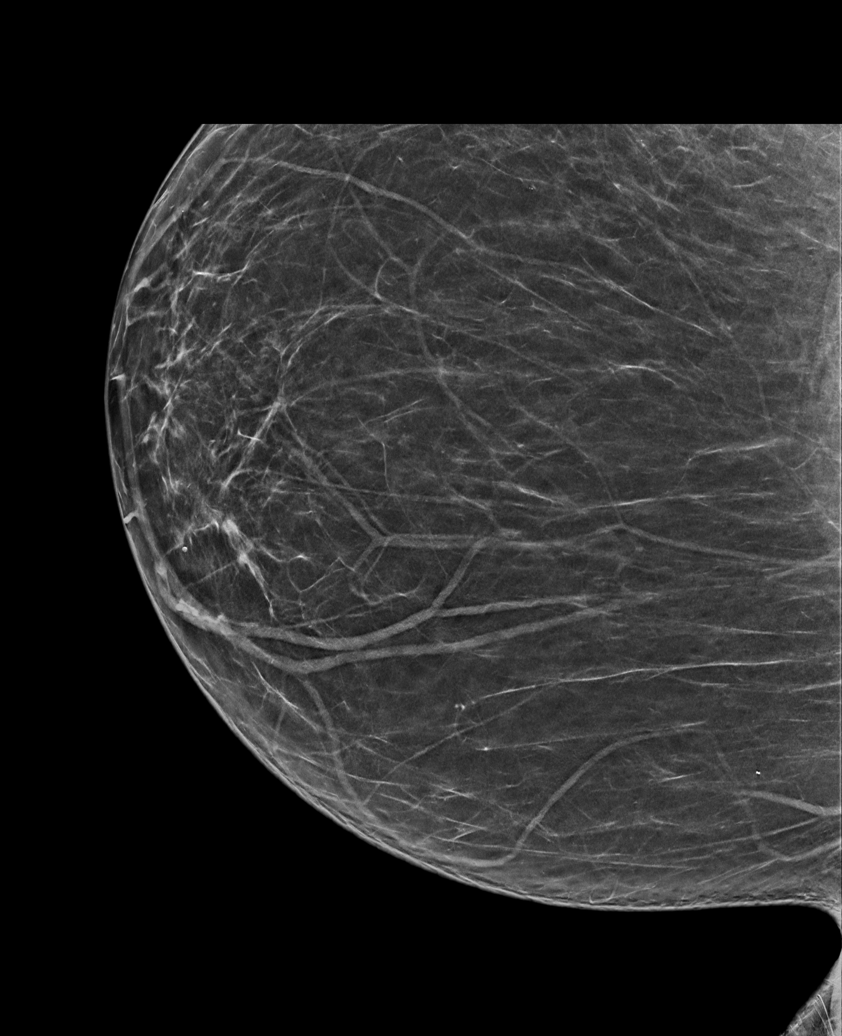

[L MLO synth-2D]
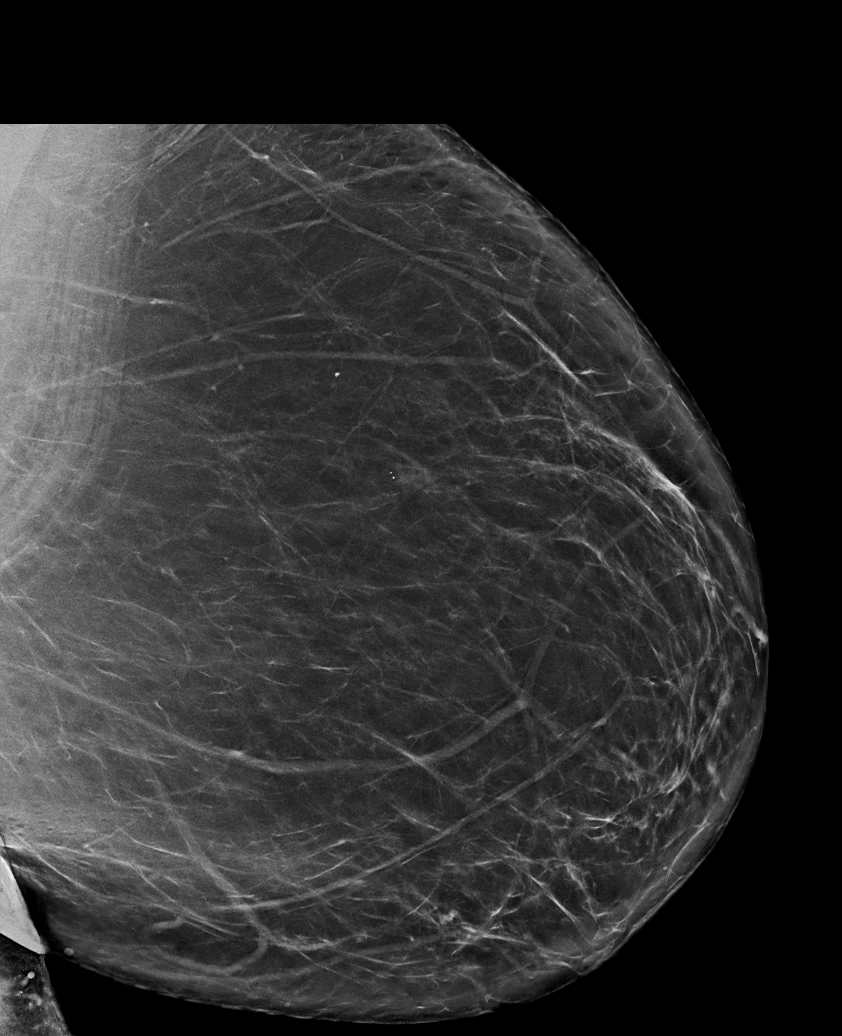

[6 of 36 positions shown; findings below may reference images not displayed]

ACR Breast Density Category b: There are scattered areas of
fibroglandular density.
FINDINGS: There are no findings suspicious for malignancy. Images were
processed with CAD.
IMPRESSION: No mammographic evidence of malignancy. A result letter of this
screening mammogram will be mailed directly to the patient.

RECOMMENDATION:
Screening mammogram in one year. (Code:[TQ])

BI-RADS CATEGORY  1: Negative.

## 2019-01-16 ENCOUNTER — Other Ambulatory Visit: Payer: Self-pay | Admitting: Physician Assistant

## 2019-01-16 DIAGNOSIS — Z1231 Encounter for screening mammogram for malignant neoplasm of breast: Secondary | ICD-10-CM

## 2019-03-02 ENCOUNTER — Ambulatory Visit
Admission: RE | Admit: 2019-03-02 | Discharge: 2019-03-02 | Disposition: A | Discharge status: Home / Self Care | Payer: Medicaid Other | Source: Ambulatory Visit | Attending: Physician Assistant | Admitting: Physician Assistant

## 2019-03-02 ENCOUNTER — Other Ambulatory Visit: Payer: Self-pay

## 2019-03-02 DIAGNOSIS — Z1231 Encounter for screening mammogram for malignant neoplasm of breast: Secondary | ICD-10-CM

## 2019-03-02 IMAGING — MG DIGITAL SCREENING BILATERAL MAMMOGRAM WITH TOMO AND CAD
8 of 16 series · 8 of 40 positions shown · non-contrast
Comparison: Previous exam(s).

ACR Breast Density Category a: The breast tissue is almost entirely
fatty.

CLINICAL DATA: Screening.

EXAM:
DIGITAL SCREENING BILATERAL MAMMOGRAM WITH TOMO AND CAD

[L CC synth-2D (1 of 2)]
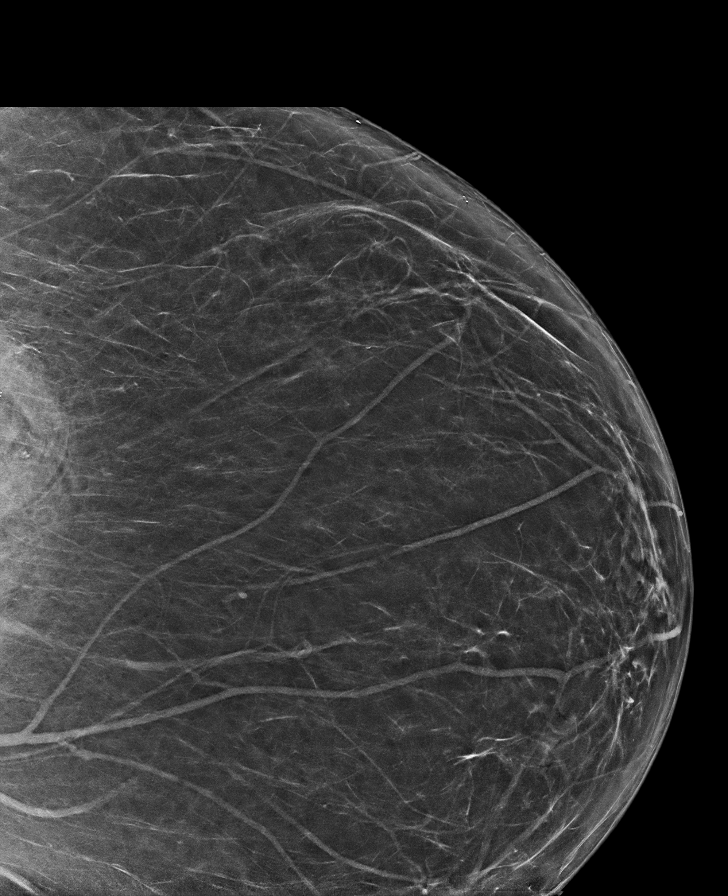

[R CC synth-2D]
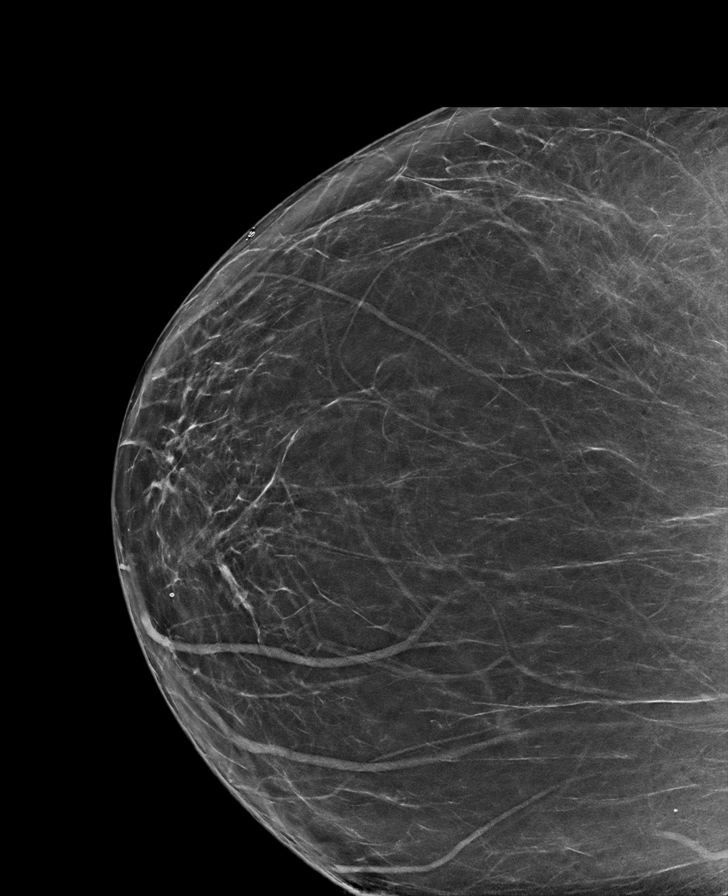

[L CV synth-2D]
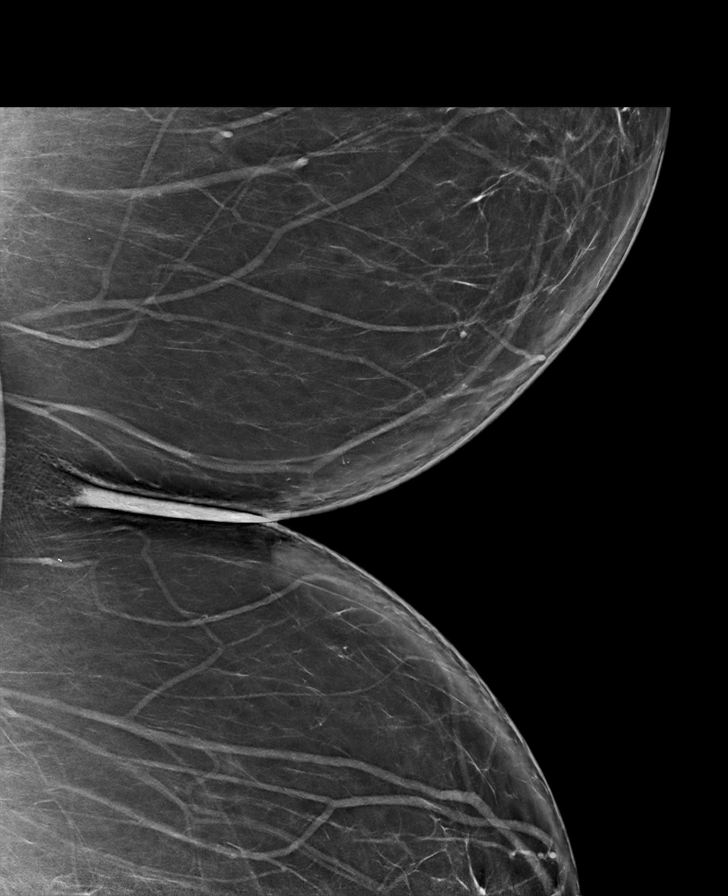

[L CC synth-2D (2 of 2)]
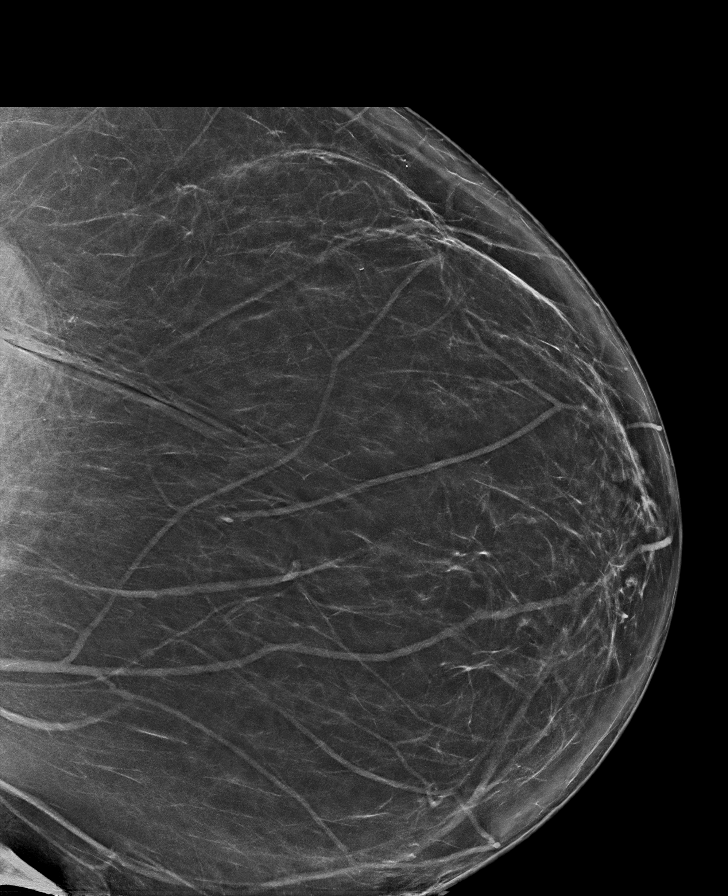

[R MLO synth-2D (1 of 2)]
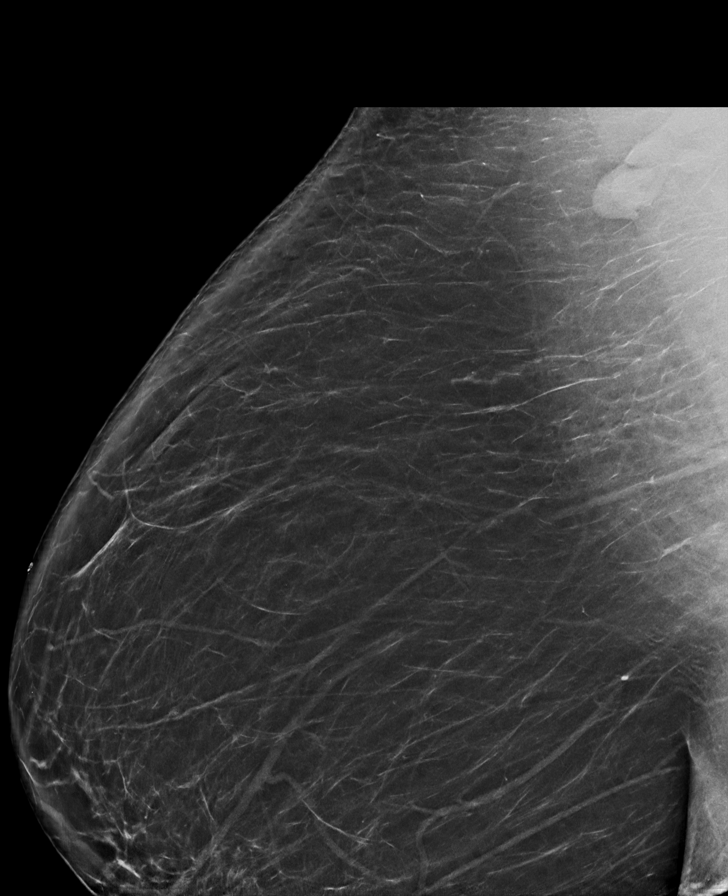

[L MLO synth-2D]
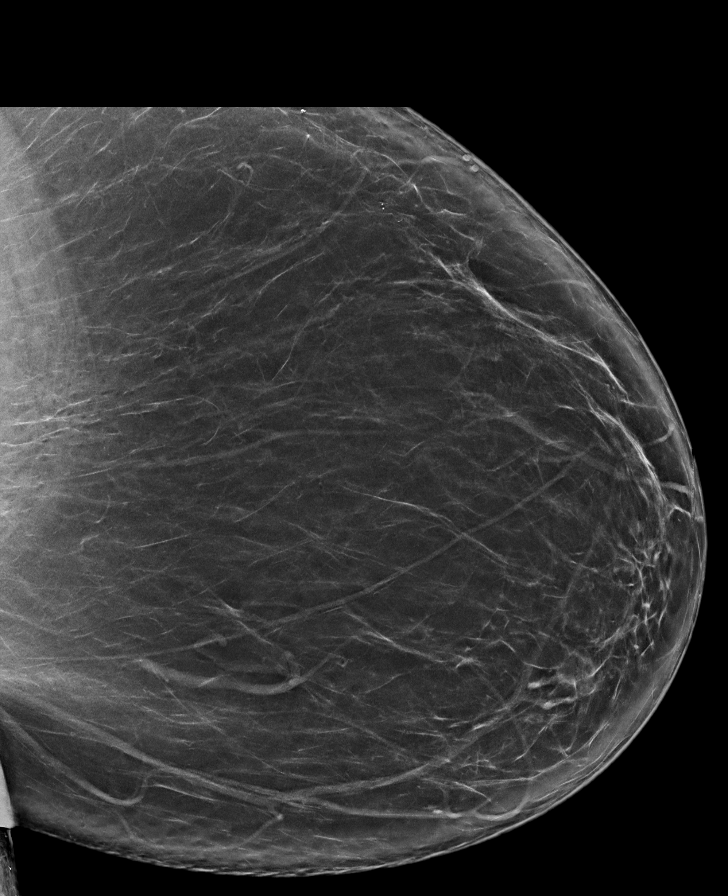

[R MLO synth-2D (2 of 2)]
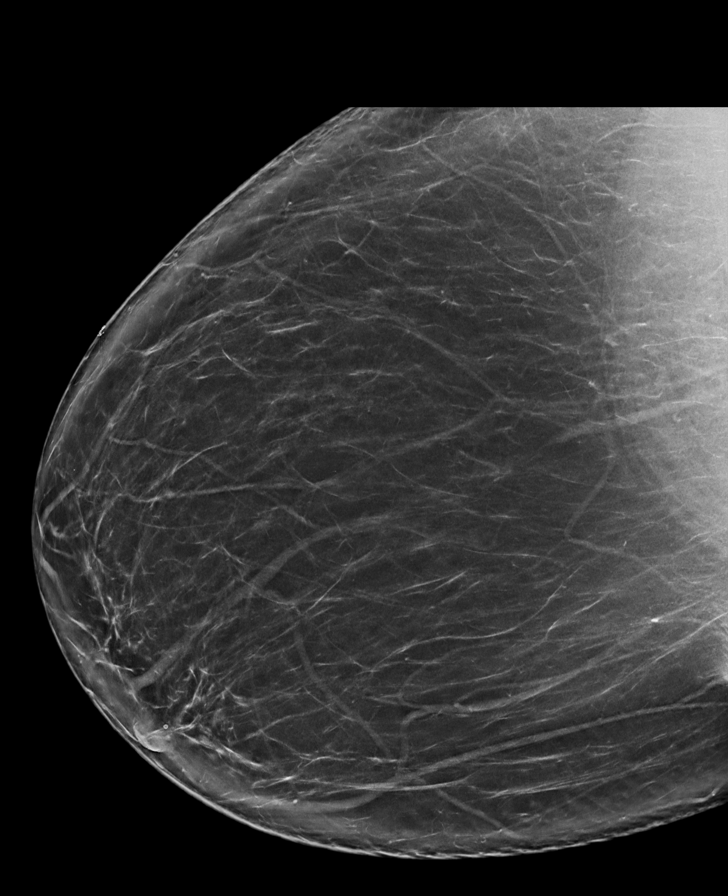

[R CC tomo · tomo slice 43/84.0]
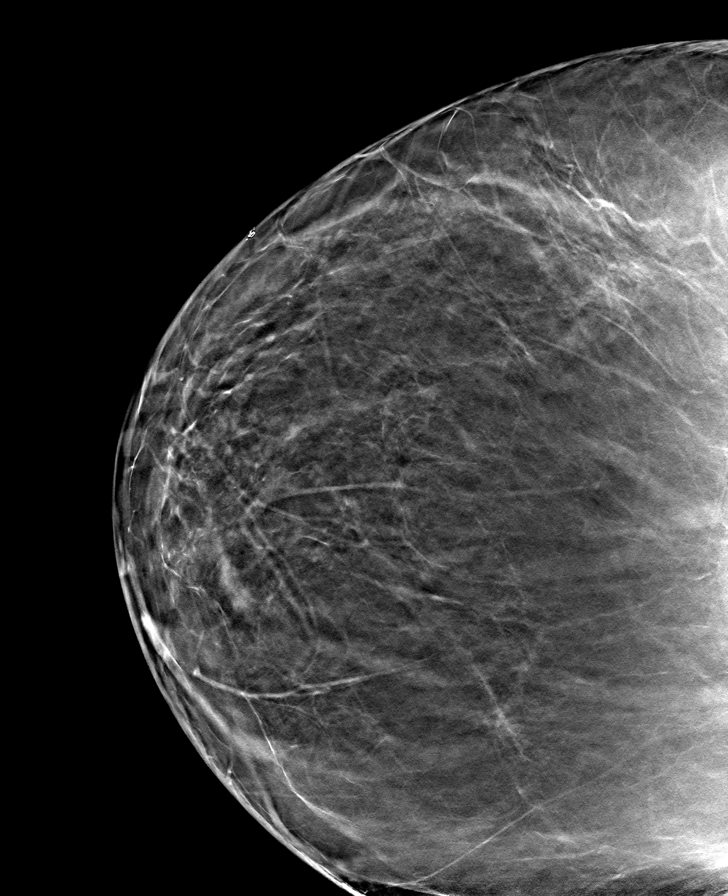

[8 of 40 positions shown; findings below may reference images not displayed]

FINDINGS: There are no findings suspicious for malignancy. Images were
processed with CAD.
IMPRESSION: No mammographic evidence of malignancy. A result letter of this
screening mammogram will be mailed directly to the patient.

RECOMMENDATION:
Screening mammogram in one year. (Code:[TA])

BI-RADS CATEGORY  1: Negative.

## 2019-05-16 ENCOUNTER — Ambulatory Visit: Payer: Medicaid Other | Attending: Adult Health Nurse Practitioner | Admitting: Physical Therapy

## 2019-05-16 ENCOUNTER — Encounter: Payer: Self-pay | Admitting: Physical Therapy

## 2019-05-16 ENCOUNTER — Other Ambulatory Visit: Payer: Self-pay

## 2019-05-16 DIAGNOSIS — M6281 Muscle weakness (generalized): Secondary | ICD-10-CM | POA: Diagnosis present

## 2019-05-16 DIAGNOSIS — G8929 Other chronic pain: Secondary | ICD-10-CM | POA: Diagnosis present

## 2019-05-16 DIAGNOSIS — M25552 Pain in left hip: Secondary | ICD-10-CM | POA: Diagnosis present

## 2019-05-16 DIAGNOSIS — M545 Low back pain: Secondary | ICD-10-CM | POA: Insufficient documentation

## 2019-05-16 NOTE — Therapy (Signed)
[facility] [address], [place], [place], [address] Phone: [phone]   Fax:  [phone]  Physical Therapy Evaluation  Patient Details  Name: Olivia Campbell MRN: [id number] Date of Birth: [birth year] Referring Provider (PT): Lars Mage, NP   Encounter Date: 05/16/2019  PT End of Session - 05/16/19 1142    Visit Number  1    Date for PT Re-Evaluation  07/16/19    Authorization Type  Medicaid    Authorization Time Period  05/16/19 to 07/15/18 (pending medicaid approval)    PT Start Time  1102    PT Stop Time  1138    PT Time Calculation (min)  36 min    Activity Tolerance  Patient tolerated treatment well;Patient limited by pain    Behavior During Therapy  [facility] for tasks assessed/performed       Past Medical History:  Diagnosis Date  . Diabetes mellitus without complication ([place])   . Hypertension     History reviewed. No pertinent surgical history.  There were no vitals filed for this visit.   Subjective Assessment - 05/16/19 1108    Subjective  Pt states that she has had low back pain on and off for several years. She noticed Lt buttock pain and shin pain approximately 1 month ago. It seemed to improve some following a hip injection and prednisone. She notes tingling in the Lt shin and foot when sitting for too long.    Limitations  Sitting    How long can you sit comfortably?  less than 5 minutes    Patient Stated Goals  decrease pain    Currently in Pain?  Yes    Pain Score  6     Pain Location  Buttocks    Pain Orientation  Left;Lateral    Pain Descriptors / Indicators  Aching;Dull;Throbbing    Pain Type  Acute pain    Pain Radiating Towards  Lt lateral shin, feels heavy    Pain Onset  1 to 4 weeks ago    Pain Frequency  Intermittent    Aggravating Factors   laying on Lt side, sitting    Pain Relieving Factors  leaning to Rt while seated, laying on stomach or Rt side         [facility] PT  Assessment - 05/16/19 0001      Assessment   Medical Diagnosis  Lt sided low back pain with Lt sided sciatica    Referring Provider (PT)  Lars Mage, NP    Onset Date/Surgical Date  --   1 month ago    Next MD Visit  none unless needed    Prior Therapy  none       Precautions   Precautions  None      Balance Screen   Has the patient fallen in the past 6 months  No    Has the patient had a decrease in activity level because of a fear of falling?   No    Is the patient reluctant to leave their home because of a fear of falling?   No      Prior Function   Leisure  crafting/crocheting      Cognition   Overall Cognitive Status  Within Functional Limits for tasks assessed      ROM / Strength   AROM / PROM / Strength  AROM;Strength      AROM   Overall AROM Comments  lumbar flexion x10 reps pain free,  lumbar extension x10 reps pain free, rotation Rt pain free, rotation Lt twisting Lt knee    AROM Assessment Site  Hip    Right/Left Hip  Right;Left    Right Hip External Rotation   25    Right Hip Internal Rotation   30    Left Hip External Rotation   25    Left Hip Internal Rotation   15      Strength   Overall Strength Comments  5/5 MMT Lt and Rt LE       Flexibility   Soft Tissue Assessment /Muscle Length  yes    Hamstrings  WNL    Quadriceps  WNL      Palpation   Spinal mobility  unable to assess due to increased guarding and tenderness with light touch     Palpation comment  tenderness Lt gluteals, piriformis       Special Tests   Other special tests  (+) slump on Lt; passive straight leg raise (-)                Objective measurements completed on examination: See above findings.              PT Education - 05/16/19 1139    Education Details  eval findings/POC; sleeping adjustments    Person(s) Educated  Patient    Methods  Explanation    Comprehension  Verbalized understanding       PT Short Term Goals - 05/16/19 1143      PT  SHORT TERM GOAL #1   Title  Pt will demo consistency with her initial HEP to decrease pain throughout the day.    Time  3    Period  Weeks    Status  New    Target Date  06/07/19      PT SHORT TERM GOAL #2   Title  Pt will be able to verbalize and demonstrate sleeping position adjustments to decrease direct pressure on the Lt hip.    Time  3    Period  Weeks        PT Long Term Goals - 05/16/19 1144      PT LONG TERM GOAL #1   Title  Pt will have greater than 35 deg of Lt hip active internal and external rotation.    Time  8    Period  Weeks    Status  New    Target Date  07/16/19      PT LONG TERM GOAL #2   Title  Pt will be able to sit for atleast 20 minutes without increase in Lt buttock pain which will allow her to complete crafting without the need for as many rest breaks.    Time  8    Period  Weeks    Status  New      PT LONG TERM GOAL #3   Title  Pt will report atleast 50% improvement in her Lt buttock/shin pain from the start of PT which will improve her quality of life.    Time  8    Period  Weeks    Status  New      PT LONG TERM GOAL #4   Title  Pt will demo safe lifting mechanics, evident by her ability to lift a #10 box from the floor without increase in pain or excessive lumbar flexion.    Time  8    Period  Weeks    Status  New  Plan - 05/16/19 1204    Clinical Impression Statement  Pt is a pleasant 53 y.o F referred to OPPT with complaints of Lt buttock and shin pain onset approximately 1 month ago. She notes occasional numbness and tingling in the shin with sitting throughout the day. Pt's pain is worse with positions that place pressure on the Lt buttock/hip. She had some improvement with a hip injection, but it continues to limit her activity participation at home. Pt has good LE strength, and her active lumbar ROM is pain free during today's evaluation. She has neural tension in the calf with slump test but passive straight leg raise  was negative. It was difficult to assess the mobility of the lumbar spine secondary to reported tenderness with light touch to the sacrum and low lumbar spine. Pt had significant muscle spasm throughout the gluteals with limited passive hip rotation as well. Pt would benefit from skilled PT to address muscle spasm and improve LE flexibility and trunk strength to decrease pain with sleep and her daily activity.    Examination-Activity Limitations  Sit;Sleep    Stability/Clinical Decision Making  Stable/Uncomplicated    Clinical Decision Making  Low    Rehab Potential  Good    PT Frequency  2x / week    PT Duration  8 weeks    PT Treatment/Interventions  ADLs/Self Care Home Management;Moist Heat;Cryotherapy;Therapeutic activities;Therapeutic exercise;Neuromuscular re-education;Manual techniques;Patient/family education;Passive range of motion;Dry needling;Taping    PT Next Visit Plan  possible d/n Lt gluteals if pt agreeable; hip rotation stretches and trunk strengthening    PT Home Exercise Plan  needs HEP next visit    Consulted and Agree with Plan of Care  Patient       Patient will benefit from skilled therapeutic intervention in order to improve the following deficits and impairments:  Decreased activity tolerance, Decreased strength, Decreased range of motion, Pain, Impaired flexibility, Obesity, Improper body mechanics, Increased muscle spasms  Visit Diagnosis: Chronic low back pain, unspecified back pain laterality, unspecified whether sciatica present  Pain in left hip  Muscle weakness (generalized)     Problem List There are no active problems to display for this patient.  1:24 PM,05/16/19 Donita Brooks PT, DPT [facility] [place]  [phone]  [facility] [address] [place], [place], [address] Phone: [phone]   Fax:  [phone]  Name: Olivia Campbell MRN:  [id number] Date of Birth: 1965-11-30

## 2019-05-23 ENCOUNTER — Ambulatory Visit: Payer: Medicaid Other

## 2019-05-23 ENCOUNTER — Other Ambulatory Visit: Payer: Self-pay

## 2019-05-23 DIAGNOSIS — M25552 Pain in left hip: Secondary | ICD-10-CM

## 2019-05-23 DIAGNOSIS — M6281 Muscle weakness (generalized): Secondary | ICD-10-CM

## 2019-05-23 DIAGNOSIS — M545 Low back pain, unspecified: Secondary | ICD-10-CM

## 2019-05-23 DIAGNOSIS — G8929 Other chronic pain: Secondary | ICD-10-CM

## 2019-05-23 NOTE — Patient Instructions (Addendum)
Access Code: [email or web address]  URL: [email or web address]  Date: 05/23/2019  Prepared by: Sigurd Sos   Exercises Seated Hamstring Stretch - 3 reps - 1 sets - 30 hold - 3x daily - 7x weekly Supine Piriformis Stretch with Leg Straight - 10 reps - 3 sets - 1x daily - 7x weekly Seated Figure 4 Piriformis Stretch - 10 reps - 1 sets - 30 hold - 3x daily - 7x weekly Supine Single Knee to Chest Stretch - 3 reps - 1 sets - 20 hold - 3x daily - 7x weekly Clamshell with Resistance - 10 reps - 3 sets - 1 hold - 2x daily - 7x weekly  Trigger Point Dry Needling  . What is Trigger Point Dry Needling (DN)? o DN is a physical therapy technique used to treat muscle pain and dysfunction. Specifically, DN helps deactivate muscle trigger points (muscle knots).  o A thin filiform needle is used to penetrate the skin and stimulate the underlying trigger point. The goal is for a local twitch response (LTR) to occur and for the trigger point to relax. No medication of any kind is injected during the procedure.   . What Does Trigger Point Dry Needling Feel Like?  o The procedure feels different for each individual patient. Some patients report that they do not actually feel the needle enter the skin and overall the process is not painful. Very mild bleeding may occur. However, many patients feel a deep cramping in the muscle in which the needle was inserted. This is the local twitch response.   Marland Kitchen How Will I feel after the treatment? o Soreness is normal, and the onset of soreness may not occur for a few hours. Typically this soreness does not last longer than two days.  o Bruising is uncommon, however; ice can be used to decrease any possible bruising.  o In rare cases feeling tired or nauseous after the treatment is normal. In addition, your symptoms may get worse before they get better, this period will typically not last longer than 24 hours.   . What Can I do After My Treatment? o Increase your  hydration by drinking more water for the next 24 hours. o You may place ice or heat on the areas treated that have become sore, however, do not use heat on inflamed or bruised areas. Heat often brings more relief post needling. o You can continue your regular activities, but vigorous activity is not recommended initially after the treatment for 24 hours. o DN is best combined with other physical therapy such as strengthening, stretching, and other therapies.    [facility] [address] [place], [place] [address] Phone # [phone] Fax [phone]

## 2019-05-23 NOTE — Therapy (Signed)
[facility] [address] [place], [place], [address] Phone: [phone]   Fax:  [phone]  Physical Therapy Treatment  Patient Details  Name: Olivia Campbell MRN: [id number] Date of Birth: [birth year] Referring Provider (PT): Sharon Seller, NP   Encounter Date: 05/23/2019  PT End of Session - 05/23/19 1232    Visit Number  2    Date for PT Re-Evaluation  07/16/19    Authorization Type  Medicaid    Authorization Time Period  3 visits 11/16-12/12/2018    Authorization - Visit Number  1    Authorization - Number of Visits  3    PT Start Time  1150    PT Stop Time  1229    PT Time Calculation (min)  39 min    Activity Tolerance  Patient tolerated treatment well    Behavior During Therapy  [facility] for tasks assessed/performed       Past Medical History:  Diagnosis Date  . Diabetes mellitus without complication ([facility])   . Hypertension     History reviewed. No pertinent surgical history.  There were no vitals filed for this visit.  Subjective Assessment - 05/23/19 1152    Subjective  I can't sleep on my Lt side.  I sleep on my Rt side.    Currently in Pain?  No/denies   pain increases to 9/10   Pain Score  0-No pain    Pain Location  Leg    Pain Orientation  Left    Pain Descriptors / Indicators  Shooting    Pain Type  Acute pain    Pain Onset  1 to 4 weeks ago    Pain Frequency  Intermittent    Aggravating Factors   sleep on Lt side, in the morning when I stand, weightbearing to the Rt    Pain Relieving Factors  sitting with Rt weightbearing                       [facility] Adult PT Treatment/Exercise - 05/23/19 0001      Exercises   Exercises  Knee/Hip;Lumbar      Lumbar Exercises: Stretches   Active Hamstring Stretch  Left;Right;3 reps;20 seconds    Single Knee to Chest Stretch  Left;3 reps;20 seconds    Piriformis Stretch  Left;3 reps;20 seconds    Piriformis Stretch Limitations   supine and seated      Knee/Hip Exercises: Sidelying   Clams  2x10 bil with abdominal bracing      Manual Therapy   Manual Therapy  Soft tissue mobilization;Myofascial release    Manual therapy comments  using addaday to Lt gluteals             PT Education - 05/23/19 1210    Education Details  Access Code: [email or web address]    Person(s) Educated  Patient    Methods  Explanation;Demonstration;Handout    Comprehension  Verbalized understanding;Returned demonstration       PT Short Term Goals - 05/16/19 1143      PT SHORT TERM GOAL #1   Title  Pt will demo consistency with her initial HEP to decrease pain throughout the day.    Time  3    Period  Weeks    Status  New    Target Date  06/07/19      PT SHORT TERM GOAL #2   Title  Pt will be able to verbalize and demonstrate sleeping position adjustments to  decrease direct pressure on the Lt hip.    Time  3    Period  Weeks        PT Long Term Goals - 05/16/19 1144      PT LONG TERM GOAL #1   Title  Pt will have greater than 35 deg of Lt hip active internal and external rotation.    Time  8    Period  Weeks    Status  New    Target Date  07/16/19      PT LONG TERM GOAL #2   Title  Pt will be able to sit for atleast 20 minutes without increase in Lt buttock pain which will allow her to complete crafting without the need for as many rest breaks.    Time  8    Period  Weeks    Status  New      PT LONG TERM GOAL #3   Title  Pt will report atleast 50% improvement in her Lt buttock/shin pain from the start of PT which will improve her quality of life.    Time  8    Period  Weeks    Status  New      PT LONG TERM GOAL #4   Title  Pt will demo safe lifting mechanics, evident by her ability to lift a #10 box from the floor without increase in pain or excessive lumbar flexion.    Time  8    Period  Weeks    Status  New            Plan - 05/23/19 1229    Clinical Impression Statement  Pt with first time follow up  after evaluation.  Session was spent establishing a HEP for hip flexibility and core strength.  PT provided information regarding dry needling and pt will consider for next session.  Pt denied any change in Lt LE radiculopathy with flexibility exercises.  Pt required tactile cues for core activation with sidelying clams.  Pt will require further education due to inconsistent contraction.  Pt with trigger points and tension in Lt gluteals and along SI joint border with manual therapy.  Pt will continue to benefit from skilled PT to address Lt LE pain and trigger points in gluteals.    PT Frequency  2x / week    PT Duration  8 weeks    PT Treatment/Interventions  ADLs/Self Care Home Management;Moist Heat;Cryotherapy;Therapeutic activities;Therapeutic exercise;Neuromuscular re-education;Manual techniques;Patient/family education;Passive range of motion;Dry needling;Taping    PT Next Visit Plan  dry needling to Lt gluteals if pt agrees.  Review HEP issued today.    PT Home Exercise Plan  initial certification is signed.    Consulted and Agree with Plan of Care  Patient       Patient will benefit from skilled therapeutic intervention in order to improve the following deficits and impairments:  Decreased activity tolerance, Decreased strength, Decreased range of motion, Pain, Impaired flexibility, Obesity, Improper body mechanics, Increased muscle spasms  Visit Diagnosis: Chronic low back pain, unspecified back pain laterality, unspecified whether sciatica present  Pain in left hip  Muscle weakness (generalized)     Problem List There are no active problems to display for this patient.    Sigurd Sos, PT 05/23/19 12:34 PM  [facility] [address], [place], [place], [address] Phone: [phone]   Fax:  [phone]  Name: Olivia Campbell MRN: [id number] Date of Birth: 1966-07-04

## 2019-05-30 ENCOUNTER — Other Ambulatory Visit: Payer: Self-pay

## 2019-05-30 ENCOUNTER — Encounter: Payer: Self-pay | Admitting: Physical Therapy

## 2019-05-30 ENCOUNTER — Ambulatory Visit: Payer: Medicaid Other | Admitting: Physical Therapy

## 2019-05-30 DIAGNOSIS — G8929 Other chronic pain: Secondary | ICD-10-CM

## 2019-05-30 DIAGNOSIS — M6281 Muscle weakness (generalized): Secondary | ICD-10-CM

## 2019-05-30 DIAGNOSIS — M25552 Pain in left hip: Secondary | ICD-10-CM

## 2019-05-30 DIAGNOSIS — M545 Low back pain: Secondary | ICD-10-CM | POA: Diagnosis not present

## 2019-05-30 NOTE — Therapy (Signed)
[facility] [address], [place], [place], [address] Phone: [phone]   Fax:  [phone]  Physical Therapy Treatment  Patient Details  Name: Olivia Campbell MRN: [id number] Date of Birth: 11/14/1965 Referring Provider (PT): Lars Mage, NP   Encounter Date: 05/30/2019  PT End of Session - 05/30/19 1103    Visit Number  3    Date for PT Re-Evaluation  07/16/19    Authorization Type  Medicaid    Authorization Time Period  3 visits 11/16-12/12/2018    Authorization - Visit Number  2    Authorization - Number of Visits  3    PT Start Time  5732    PT Stop Time  1059    PT Time Calculation (min)  43 min    Activity Tolerance  Patient tolerated treatment well;No increased pain    Behavior During Therapy  [facility] for tasks assessed/performed       Past Medical History:  Diagnosis Date  . Diabetes mellitus without complication ([place])   . Hypertension     History reviewed. No pertinent surgical history.  There were no vitals filed for this visit.  Subjective Assessment - 05/30/19 1019    Subjective  Pt states that she had a pretty good week but today when she woke up her leg was bothering her. HEP is going well.    Currently in Pain?  Yes    Pain Score  5     Pain Location  Buttocks    Pain Orientation  Left;Posterior    Pain Descriptors / Indicators  Aching    Pain Type  Chronic pain    Pain Radiating Towards  none    Pain Onset  1 to 4 weeks ago    Pain Frequency  Intermittent    Aggravating Factors   laying on Lt side                       [facility] Adult PT Treatment/Exercise - 05/30/19 0001      Lumbar Exercises: Supine   Other Supine Lumbar Exercises  abdominal brace with bent knee fallout x10 reps each       Knee/Hip Exercises: Supine   Other Supine Knee/Hip Exercises  hip extension isometric 10x5 sec hold (hip in neutral)     Other Supine Knee/Hip Exercises  Lt sciatic nerve  floss, PT assisting Lt LE x10 reps       Knee/Hip Exercises: Sidelying   Hip ABduction  Left;Strengthening;1 set;10 reps    Hip ABduction Limitations  PT cuing to prevent hip flexion compensation      Manual Therapy   Manual Therapy  Myofascial release    Manual therapy comments  using addaday: Lt gluteals, Lt proximal hamstring pt in prone with pillow underneath pelvic    Myofascial Release  trigger point release Lt piriformis during active clamshell on Lt x10 reps              PT Education - 05/30/19 1103    Education Details  technique with therex    Person(s) Educated  Patient    Methods  Explanation;Verbal cues    Comprehension  Verbalized understanding       PT Short Term Goals - 05/16/19 1143      PT SHORT TERM GOAL #1   Title  Pt will demo consistency with her initial HEP to decrease pain throughout the day.    Time  3  Period  Weeks    Status  New    Target Date  06/07/19      PT SHORT TERM GOAL #2   Title  Pt will be able to verbalize and demonstrate sleeping position adjustments to decrease direct pressure on the Lt hip.    Time  3    Period  Weeks        PT Long Term Goals - 05/16/19 1144      PT LONG TERM GOAL #1   Title  Pt will have greater than 35 deg of Lt hip active internal and external rotation.    Time  8    Period  Weeks    Status  New    Target Date  07/16/19      PT LONG TERM GOAL #2   Title  Pt will be able to sit for atleast 20 minutes without increase in Lt buttock pain which will allow her to complete crafting without the need for as many rest breaks.    Time  8    Period  Weeks    Status  New      PT LONG TERM GOAL #3   Title  Pt will report atleast 50% improvement in her Lt buttock/shin pain from the start of PT which will improve her quality of life.    Time  8    Period  Weeks    Status  New      PT LONG TERM GOAL #4   Title  Pt will demo safe lifting mechanics, evident by her ability to lift a #10 box from the floor  without increase in pain or excessive lumbar flexion.    Time  8    Period  Weeks    Status  New            Plan - 05/30/19 1104    Clinical Impression Statement  Pt felt good relief following manual treatment last session but woke up this morning with Lt buttock pain. At this time, she would like to hold off on dry needling to see how she responds to other treatment options. She was able to complete hip flexibility and strengthening exercises without exacerbation of her pain, but there was noted increased pain with attempts at completing posterior pelvic tilt. Pt's pain was resolved when instructed in abdominal bracing and this was encouraged for the remainder of the session. Ended with soft tissue mobilization to the gluteals and hamstring. No increase in pain was reported following today's session.    PT Frequency  2x / week    PT Duration  8 weeks    PT Treatment/Interventions  ADLs/Self Care Home Management;Moist Heat;Cryotherapy;Therapeutic activities;Therapeutic exercise;Neuromuscular re-education;Manual techniques;Patient/family education;Passive range of motion;Dry needling;Taping    PT Next Visit Plan  dry needling to Lt gluteals if pt agrees.  Review HEP issued today.    PT Home Exercise Plan  initial certification is signed.    Consulted and Agree with Plan of Care  Patient       Patient will benefit from skilled therapeutic intervention in order to improve the following deficits and impairments:  Decreased activity tolerance, Decreased strength, Decreased range of motion, Pain, Impaired flexibility, Obesity, Improper body mechanics, Increased muscle spasms  Visit Diagnosis: Chronic low back pain, unspecified back pain laterality, unspecified whether sciatica present  Pain in left hip  Muscle weakness (generalized)     Problem List There are no active problems to display for this patient.   12:52  PM,05/30/19 Donita Brooks PT, DPT [facility] [place]  [phone]  [facility] [address] [place], [place], [address] Phone: [phone]   Fax:  [phone]  Name: JEN BENEDICT MRN: [id number] Date of Birth: Jan 28, 1966

## 2019-06-06 ENCOUNTER — Other Ambulatory Visit: Payer: Self-pay

## 2019-06-06 ENCOUNTER — Encounter: Payer: Self-pay | Admitting: Physical Therapy

## 2019-06-06 ENCOUNTER — Ambulatory Visit: Payer: Medicaid Other | Attending: Adult Health Nurse Practitioner | Admitting: Physical Therapy

## 2019-06-06 DIAGNOSIS — M25552 Pain in left hip: Secondary | ICD-10-CM | POA: Diagnosis present

## 2019-06-06 DIAGNOSIS — M6281 Muscle weakness (generalized): Secondary | ICD-10-CM

## 2019-06-06 DIAGNOSIS — G8929 Other chronic pain: Secondary | ICD-10-CM | POA: Diagnosis present

## 2019-06-06 DIAGNOSIS — M545 Low back pain, unspecified: Secondary | ICD-10-CM

## 2019-06-06 NOTE — Patient Instructions (Signed)
Access Code: [email or web address]  URL: [email or web address]  Date: 06/06/2019  Prepared by: Sherol Dade   Exercises  Seated Figure 4 Piriformis Stretch - 10 reps - 1 sets - 30 hold - 3x daily - 7x weekly  Shoulder extension with resistance - Neutral - 10 reps - 3 sets - 1x daily - 7x weekly  Supine Single Knee to Chest Stretch - 3 reps - 1 sets - 20 hold - 3x daily - 7x weekly  Clamshell with Resistance - 10 reps - 3 sets - 1 hold - 2x daily - 7x weekly    [facility] [address] [place], [place] [address] Phone # [phone] Fax [phone]

## 2019-06-07 NOTE — Therapy (Signed)
[facility] [address], [place], [place], [address] Phone: [phone]   Fax:  [phone]  Physical Therapy Treatment  Patient Details  Name: Olivia Campbell MRN: [id number] Date of Birth: [birth year] Referring Provider (PT): Lars Mage, NP   Encounter Date: 06/06/2019  PT End of Session - 06/06/19 1153    Visit Number  4    Date for PT Re-Evaluation  07/16/19    Authorization Type  Medicaid    Authorization Time Period  3 visits 11/16-12/12/2018    Authorization - Visit Number  3    Authorization - Number of Visits  3    PT Start Time  2751    PT Stop Time  1230    PT Time Calculation (min)  39 min    Activity Tolerance  Patient tolerated treatment well;No increased pain    Behavior During Therapy  WFL for tasks assessed/performed       Past Medical History:  Diagnosis Date  . Diabetes mellitus without complication ([place])   . Hypertension     History reviewed. No pertinent surgical history.  There were no vitals filed for this visit.  Subjective Assessment - 06/06/19 1151    Subjective  Pt states that she felt good after her last session. She woke up with some pain in the buttock/low back region and seemed to subside after completing her HEP.    Currently in Pain?  No/denies    Pain Onset  1 to 4 weeks ago         [facility] PT Assessment - 06/07/19 0001      AROM   Left Hip Internal Rotation   30      Strength   Overall Strength Comments  5/5 MMT      Palpation   Palpation comment  tenderness Lt gluteals                    [facility] Adult PT Treatment/Exercise - 06/07/19 0001      Self-Care   Self-Care  Posture    Posture  reviewed sleeping positions for optimal comfort      Lumbar Exercises: Standing   Other Standing Lumbar Exercises  single arm pressdown with red TB x5 reps each for HEP demo       Manual Therapy   Manual Therapy  Joint mobilization    Joint Mobilization   CPAs Grade III-IV x2 bouts, T6-L3    Myofascial Release  STM Lt gluteals, ITB             PT Education - 06/06/19 1339    Education Details  POC moving forward; updated HEP    Person(s) Educated  Patient    Methods  Explanation;Handout;Verbal cues    Comprehension  Verbalized understanding;Returned demonstration       PT Short Term Goals - 06/06/19 1156      PT SHORT TERM GOAL #1   Title  Pt will demo consistency with her initial HEP to decrease pain throughout the day.    Time  3    Period  Weeks    Status  Achieved    Target Date  06/07/19      PT SHORT TERM GOAL #2   Title  Pt will be able to verbalize and demonstrate sleeping position adjustments to decrease direct pressure on the Lt hip.    Time  3    Period  Weeks    Status  Achieved  PT Long Term Goals - 06/06/19 1157      PT LONG TERM GOAL #1   Title  Pt will have greater than 35 deg of Lt hip active internal and external rotation.    Baseline  30deg    Time  8    Period  Weeks    Status  Partially Met      PT LONG TERM GOAL #2   Title  Pt will be able to sit for atleast 20 minutes without increase in Lt buttock pain which will allow her to complete crafting without the need for as many rest breaks.    Baseline  sometimes all day, sometimes less than 5 minutes    Time  8    Period  Weeks    Status  Partially Met      PT LONG TERM GOAL #3   Title  Pt will report atleast 50% improvement in her Lt buttock/shin pain from the start of PT which will improve her quality of life.    Time  8    Period  Weeks    Status  New      PT LONG TERM GOAL #4   Title  Pt will demo safe lifting mechanics, evident by her ability to lift a #10 box from the floor without increase in pain or excessive lumbar flexion.    Time  8    Period  Weeks    Status  New            Plan - 06/06/19 1341    Clinical Impression Statement  Pt is making progress towards her goals. She has been making proper sleep  adjustments and is consistently completing her HEP throughout the week. Pt reports good relief with manual techniques completed during her session and has noticed less pain in the LE this morning. She has improved hip flexibility with hip internal rotation increased by more than 10 deg compared to the evaluation and reports decrease in lumbar spine tenderness with palpation. Pt continues to have weakness in the trunk and tenderness in the lumbar paraspinals and gluteals contributing to pain throughout the night and with daily activity. She would continue to benefit from skilled PT to promote good body mechanics and trunk stability throughout her day.    PT Frequency  2x / week    PT Duration  8 weeks    PT Treatment/Interventions  ADLs/Self Care Home Management;Moist Heat;Cryotherapy;Therapeutic activities;Therapeutic exercise;Neuromuscular re-education;Manual techniques;Patient/family education;Passive range of motion;Dry needling;Taping    PT Next Visit Plan  manual to gluteals as needed; progress trunk stability/strength    PT Home Exercise Plan  Access Code: [email or web address]    Consulted and Agree with Plan of Care  Patient       Patient will benefit from skilled therapeutic intervention in order to improve the following deficits and impairments:  Decreased activity tolerance, Decreased strength, Decreased range of motion, Pain, Impaired flexibility, Obesity, Improper body mechanics, Increased muscle spasms  Visit Diagnosis: Chronic low back pain, unspecified back pain laterality, unspecified whether sciatica present  Pain in left hip  Muscle weakness (generalized)     Problem List There are no active problems to display for this patient.   7:49 AM,06/07/19 Sherol Dade PT, [place] [facility] [address], [place], [place], [address] Phone: [phone]   Fax:   [phone]  Name: Olivia Campbell MRN: [id number] Date of Birth:  06/19/1966   

## 2019-07-12 ENCOUNTER — Encounter: Payer: Self-pay | Admitting: Physical Therapy

## 2019-07-12 ENCOUNTER — Ambulatory Visit: Payer: Medicaid Other | Attending: Adult Health Nurse Practitioner | Admitting: Physical Therapy

## 2019-07-12 ENCOUNTER — Other Ambulatory Visit: Payer: Self-pay

## 2019-07-12 DIAGNOSIS — M25552 Pain in left hip: Secondary | ICD-10-CM

## 2019-07-12 DIAGNOSIS — G8929 Other chronic pain: Secondary | ICD-10-CM | POA: Insufficient documentation

## 2019-07-12 DIAGNOSIS — M6281 Muscle weakness (generalized): Secondary | ICD-10-CM

## 2019-07-12 DIAGNOSIS — M545 Low back pain, unspecified: Secondary | ICD-10-CM

## 2019-07-12 NOTE — Therapy (Signed)
[facility] [address], [place], [place], [address] Phone: [phone]   Fax:  [phone]  Physical Therapy Treatment/Re-eval  Patient Details  Name: Olivia Campbell MRN: [id number] Date of Birth: August 25, 1965 Referring Provider (PT): Lars Mage, NP   Encounter Date: 07/12/2019  PT End of Session - 07/12/19 1151    Visit Number  5    Date for PT Re-Evaluation  08/24/19    Authorization Type  Medicaid    Authorization Time Period  07/12/19 to 08/24/19    Authorization - Visit Number  1    Authorization - Number of Visits  3    PT Start Time  1104    PT Stop Time  0017    PT Time Calculation (min)  41 min    Activity Tolerance  Patient tolerated treatment well;No increased pain    Behavior During Therapy  WFL for tasks assessed/performed       Past Medical History:  Diagnosis Date  . Diabetes mellitus without complication ([place])   . Hypertension     History reviewed. No pertinent surgical history.  There were no vitals filed for this visit.  Subjective Assessment - 07/12/19 1107    Subjective  Pt states that things are going well. She had fluctuating days of pain over the past several weeks. She has found that she can find some relief in her pain when bringing her knees to the Rt.    Currently in Pain?  Yes    Pain Score  3     Pain Orientation  Left;Posterior    Pain Descriptors / Indicators  Aching;Dull    Pain Type  Chronic pain    Pain Radiating Towards  none currently    Pain Onset  1 to 4 weeks ago    Aggravating Factors   getting up out of bed    Pain Relieving Factors  supine dropping knees to the Rt         [facility] PT Assessment - 07/12/19 0001      Assessment   Medical Diagnosis  Lt sided low back pain with Lt sided sciatica    Referring Provider (PT)  Lars Mage, NP    Onset Date/Surgical Date  --   1 month ago    Next MD Visit  none unless needed    Prior Therapy  none        Precautions   Precautions  None      Prior Function   Leisure  crafting/crocheting      Cognition   Overall Cognitive Status  Within Functional Limits for tasks assessed      AROM   Overall AROM Comments  lumbar flexion x10 reps pain free, lumbar extension x10 reps pain free, rotation Rt pain free, rotation Lt twisting Lt knee    Right Hip External Rotation   25    Right Hip Internal Rotation   30    Left Hip External Rotation   25    Left Hip Internal Rotation   30      Strength   Overall Strength Comments  5/5 MMT Lt and Rt LE       Flexibility   Soft Tissue Assessment /Muscle Length  yes    Hamstrings  WNL    Quadriceps  WNL      Palpation   Spinal mobility  --    Palpation comment  tenderness Lt gluteals, piriformis       Special  Tests   Other special tests  (+) slump on Lt; passive straight leg raise (-)                   [facility] Adult PT Treatment/Exercise - 07/12/19 0001      Therapeutic Activites    Therapeutic Activities  Other Therapeutic Activities    Other Therapeutic Activities  log rolling technique with supine to sit with verbal/tactile cuing       Lumbar Exercises: Standing   Other Standing Lumbar Exercises  BUE pressdown with green TB x12 reps       Lumbar Exercises: Seated   Other Seated Lumbar Exercises  abdominal bracing with hip flexion 5x5 sec hold each side       Lumbar Exercises: Supine   Pelvic Tilt Limitations  3 sec hold for 10 reps     Other Supine Lumbar Exercises  trunk rotation with LE on red physioball x5 reps each pain increased both directions     Other Supine Lumbar Exercises  B hamstring curl isometric hold into red physioball 10x5 sec hold       Manual Therapy   Myofascial Release  Addaday to Lt gluteals in Rt sidelying              PT Education - 07/12/19 1150    Education Details  log roll; updated HEP    Person(s) Educated  Patient    Methods  Explanation;Handout;Verbal cues    Comprehension  Verbalized  understanding;Returned demonstration       PT Short Term Goals - 07/12/19 1119      PT SHORT TERM GOAL #1   Title  Pt will demo consistency with her initial HEP to decrease pain throughout the day.    Time  3    Period  Weeks    Status  Achieved    Target Date  06/07/19      PT SHORT TERM GOAL #2   Title  Pt will be able to verbalize and demonstrate sleeping position adjustments to decrease direct pressure on the Lt hip.    Time  3    Period  Weeks    Status  Achieved        PT Long Term Goals - 07/12/19 1121      PT LONG TERM GOAL #1   Title  Pt will have greater than 35 deg of Lt hip active internal and external rotation.    Baseline  30deg    Time  6    Period  Weeks    Status  Partially Met    Target Date  08/24/19      PT LONG TERM GOAL #2   Title  Pt will be able to sit for atleast 20 minutes without increase in Lt buttock pain which will allow her to complete crafting without the need for as many rest breaks.    Baseline  sometimes all day, sometimes less than 5 minutes    Time  6    Period  Weeks    Status  Partially Met      PT LONG TERM GOAL #3   Title  Pt will report atleast 50% improvement in her Lt buttock/shin pain from the start of PT which will improve her quality of life.    Baseline  unsure, pain changes    Time  6    Period  Weeks    Status  On-going      PT LONG TERM GOAL #4  Title  Pt will demo safe lifting mechanics, evident by her ability to lift a #10 box from the floor without increase in pain or excessive lumbar flexion.    Time  6    Period  Weeks    Status  On-going            Plan - 07/12/19 1152    Clinical Impression Statement  Pt has been making progress towards her goals. She is making sleep adjustments and is consistently completing her HEP throughout the week. Although her pain continues to fluctuate throughout the day, particularly during transitions in/out of bed, she reports that she has been able to lessen her pain  when getting out of bed by completing her HEP in the mornings. Pt has improved hip flexibility with hip internal rotation noted at her last session. Pt continues to have evident weakness in the trunk and tenderness in the lumbar paraspinals and gluteals contributing to pain throughout the day. She was able to demonstrate improved lower abdominal activation by the end of today's session, and she would continue to benefit from skilled PT to promote good body mechanics and trunk stability throughout her day.    PT Frequency  2x / week    PT Duration  6 weeks    PT Treatment/Interventions  ADLs/Self Care Home Management;Moist Heat;Cryotherapy;Therapeutic activities;Therapeutic exercise;Neuromuscular re-education;Manual techniques;Patient/family education;Passive range of motion;Dry needling;Taping;Aquatic Therapy;Traction    PT Next Visit Plan  manual to gluteals/lumbar spine as needed; progress trunk stability/strength; nerve flossing    PT Home Exercise Plan  Access Code: [email or web address]    Consulted and Agree with Plan of Care  Patient       Patient will benefit from skilled therapeutic intervention in order to improve the following deficits and impairments:  Decreased activity tolerance, Decreased strength, Decreased range of motion, Pain, Impaired flexibility, Obesity, Improper body mechanics, Increased muscle spasms  Visit Diagnosis: Chronic low back pain, unspecified back pain laterality, unspecified whether sciatica present  Pain in left hip  Muscle weakness (generalized)     Problem List There are no problems to display for this patient.   12:02 PM,07/12/19 [place], [place] [facility] [place]  [facility] [address] [place], [place], [address] Phone: [phone]   Fax:  [phone]  Name: Olivia Campbell MRN: [id number] Date of Birth: [birth year]

## 2019-07-12 NOTE — Patient Instructions (Signed)
Access Code: [email or web address]  URL: [email or web address]  Date: 07/12/2019  Prepared by: Donita Brooks    Exercises Shoulder extension with resistance - Neutral- 10 reps- 3 sets- 1x daily- 7x weekly  Supine Single Knee to Chest Stretch- 3 reps- 1 sets- 20 hold- 3x daily- 7x weekly  Supine Posterior Pelvic Tilt- 10 reps- 3 sets- 1x daily- 7x weekly  Clamshell with Resistance- 10 reps- 3 sets- 1 hold- 2x daily- 7x weekly    [facility] [address] [place], [place] [address] Phone # [phone] Fax [phone]

## 2019-07-19 ENCOUNTER — Other Ambulatory Visit: Payer: Self-pay

## 2019-07-19 ENCOUNTER — Encounter: Payer: Self-pay | Admitting: Physical Therapy

## 2019-07-19 ENCOUNTER — Ambulatory Visit: Payer: Medicaid Other | Admitting: Physical Therapy

## 2019-07-19 DIAGNOSIS — G8929 Other chronic pain: Secondary | ICD-10-CM

## 2019-07-19 DIAGNOSIS — M545 Low back pain, unspecified: Secondary | ICD-10-CM

## 2019-07-19 DIAGNOSIS — M25552 Pain in left hip: Secondary | ICD-10-CM

## 2019-07-19 DIAGNOSIS — M6281 Muscle weakness (generalized): Secondary | ICD-10-CM

## 2019-07-19 NOTE — Therapy (Signed)
[facility] [address] [place], [place], [address] Phone: [phone]   Fax:  [phone]  Physical Therapy Treatment  Patient Details  Name: Olivia Campbell MRN: [id number] Date of Birth: [birth year] Referring Provider (PT): Lars Mage, NP   Encounter Date: 07/19/2019  PT End of Session - 07/19/19 1104    Visit Number  6    Date for PT Re-Evaluation  07/16/19    Authorization Type  Medicaid    Authorization Time Period  07/12/19 to 08/24/19    Authorization - Visit Number  2    Authorization - Number of Visits  3    PT Start Time  1101    PT Stop Time  1139    PT Time Calculation (min)  38 min    Activity Tolerance  Patient tolerated treatment well;No increased pain    Behavior During Therapy  WFL for tasks assessed/performed       Past Medical History:  Diagnosis Date  . Diabetes mellitus without complication ([place])   . Hypertension     History reviewed. No pertinent surgical history.  There were no vitals filed for this visit.  Subjective Assessment - 07/19/19 1103    Subjective  Pt states that things are going well. She has some pain currently in the hip and lateral calf. HEP is going well and seems to help when she holds her stomach tight.    Currently in Pain?  Yes    Pain Score  6     Pain Location  Buttocks    Pain Orientation  Left;Posterior;Lateral    Pain Descriptors / Indicators  Aching    Pain Type  Chronic pain    Pain Radiating Towards  pain in Lt lateral calf    Pain Onset  More than a month ago    Pain Frequency  Intermittent                       [facility] Adult PT Treatment/Exercise - 07/19/19 0001      Lumbar Exercises: Seated   Other Seated Lumbar Exercises  abdominal brace on dyna disc: BUE horizontal abduction red TB x10 reps; dyna disc abdominal brace with hip flexion 2x5 reps each       Lumbar Exercises: Supine   Other Supine Lumbar Exercises  adductor ball  squeeze with abdominal brace 10x3 sec hold; adductor ball squeeze with UE horizontal abduction red TB x10 reps     Other Supine Lumbar Exercises  B hamstring curl isometric hold into red physioball 10x5 sec hold, 2 sets; hooklying abdominal brace with alternating UE flexion yellow TB x10 reps each       Knee/Hip Exercises: Stretches   Other Knee/Hip Stretches  sciatic nerve flossing on Lt x15 reps, PT assisting LE movement      Manual Therapy   Myofascial Release  Addaday to Lt gluteals in Rt sidelying              PT Education - 07/19/19 1139    Education Details  updates to [employer]) Educated  Patient    Methods  Explanation;Handout    Comprehension  Verbalized understanding;Returned demonstration       PT Short Term Goals - 07/19/19 1128      PT SHORT TERM GOAL #1   Title  Pt will demo consistency with her initial HEP to decrease pain throughout the day.    Time  3  Period  Weeks    Status  Achieved    Target Date  06/07/19      PT SHORT TERM GOAL #2   Title  Pt will be able to verbalize and demonstrate sleeping position adjustments to decrease direct pressure on the Lt hip.    Time  3    Period  Weeks    Status  Achieved        PT Long Term Goals - 07/12/19 1121      PT LONG TERM GOAL #1   Title  Pt will have greater than 35 deg of Lt hip active internal and external rotation.    Baseline  30deg    Time  6    Period  Weeks    Status  Partially Met    Target Date  08/24/19      PT LONG TERM GOAL #2   Title  Pt will be able to sit for atleast 20 minutes without increase in Lt buttock pain which will allow her to complete crafting without the need for as many rest breaks.    Baseline  sometimes all day, sometimes less than 5 minutes    Time  6    Period  Weeks    Status  Partially Met      PT LONG TERM GOAL #3   Title  Pt will report atleast 50% improvement in her Lt buttock/shin pain from the start of PT which will improve her quality of life.     Baseline  unsure, pain changes    Time  6    Period  Weeks    Status  On-going      PT LONG TERM GOAL #4   Title  Pt will demo safe lifting mechanics, evident by her ability to lift a #10 box from the floor without increase in pain or excessive lumbar flexion.    Time  6    Period  Weeks    Status  On-going            Plan - 07/19/19 1140    Clinical Impression Statement  Pt has had a "good" week since her last session. She has noticed improvements in her pain when using the log roll technique. Session focused on therex to promote deep abdominal strength and endurance. Pt did well with supine therex, but she required additional PT cuing to increase her posture awareness with seated exercise. Pt's HEP was updated to reflect her increasing strength. Ended with soft tissue mobilization to the Lt gluteals which pt reports helps with tension in the area.    PT Frequency  2x / week    PT Duration  6 weeks    PT Treatment/Interventions  ADLs/Self Care Home Management;Moist Heat;Cryotherapy;Therapeutic activities;Therapeutic exercise;Neuromuscular re-education;Manual techniques;Patient/family education;Passive range of motion;Dry needling;Taping;Aquatic Therapy;Traction    PT Next Visit Plan  manual to gluteals/lumbar spine as needed; progress trunk stability/strength; nerve flossing    PT Home Exercise Plan  Access Code: [email or web address]    Consulted and Agree with Plan of Care  Patient       Patient will benefit from skilled therapeutic intervention in order to improve the following deficits and impairments:  Decreased activity tolerance, Decreased strength, Decreased range of motion, Pain, Impaired flexibility, Obesity, Improper body mechanics, Increased muscle spasms  Visit Diagnosis: Chronic low back pain, unspecified back pain laterality, unspecified whether sciatica present  Pain in left hip  Muscle weakness (generalized)     Problem List There are no problems  to display for this  patient.   11:47 AM,07/19/19 [place], [place] [facility] [address], [place], [place], [address] Phone: [phone]   Fax:  [phone]  Name: Olivia Campbell MRN: [id number] Date of Birth: 04/09/1966

## 2019-07-19 NOTE — Patient Instructions (Signed)
Access Code: [email or web address]  URL: [email or web address]  Date: 07/19/2019  Prepared by: Donita Brooks    Exercises Shoulder extension with resistance - Neutral- 10 reps- 3 sets- 1x daily- 7x weekly  Supine Posterior Pelvic Tilt- 10 reps- 3 sets- 1x daily- 7x weekly  Standing Anti-Rotation Press with Anchored Resistance- 10-15 reps- 2 sets- 1x daily- 7x weekly  Clamshell with Resistance- 10 reps- 3 sets- 1 hold- 2x daily- 7x weekly    [facility] [address], [address] [place], [place] [address] Phone # [phone] Fax [phone]

## 2019-07-26 ENCOUNTER — Other Ambulatory Visit: Payer: Self-pay

## 2019-07-26 ENCOUNTER — Encounter: Payer: Self-pay | Admitting: Physical Therapy

## 2019-07-26 ENCOUNTER — Ambulatory Visit: Payer: Medicaid Other | Admitting: Physical Therapy

## 2019-07-26 DIAGNOSIS — M545 Low back pain, unspecified: Secondary | ICD-10-CM

## 2019-07-26 DIAGNOSIS — G8929 Other chronic pain: Secondary | ICD-10-CM

## 2019-07-26 DIAGNOSIS — M25552 Pain in left hip: Secondary | ICD-10-CM

## 2019-07-26 DIAGNOSIS — M6281 Muscle weakness (generalized): Secondary | ICD-10-CM

## 2019-07-26 NOTE — Therapy (Addendum)
[facility] [address], [place], [place], [address] Phone: [phone]   Fax:  [phone]  Physical Therapy Treatment  Patient Details  Name: Olivia Campbell MRN: [id number] Date of Birth: November 02, 1965 Referring Provider (PT): Lars Mage, NP   Encounter Date: 07/26/2019  PT End of Session - 07/26/19 1418    Visit Number  7    Date for PT Re-Evaluation  08/24/19    Authorization Type  Medicaid    Authorization Time Period  07/12/19 to 08/24/19    Authorization - Visit Number  3    Authorization - Number of Visits  3    PT Start Time  5188    PT Stop Time  4166    PT Time Calculation (min)  42 min    Activity Tolerance  Patient tolerated treatment well;No increased pain    Behavior During Therapy  WFL for tasks assessed/performed       Past Medical History:  Diagnosis Date  . Diabetes mellitus without complication ([place])   . Hypertension     History reviewed. No pertinent surgical history.  There were no vitals filed for this visit.  Subjective Assessment - 07/26/19 1106    Subjective  Pt states that she had a piece of cake for her birthday and she has had more pain since then. She has tried chair exercises and seemed to do ok with this. She has more tingling throughout the day. Getting up from the bed is better since starting PT but she will have electricity in the Lt leg shortly after    Pain Onset  More than a month ago         [facility] PT Assessment - 07/26/19 0001      Assessment   Medical Diagnosis  Lt sided low back pain with Lt sided sciatica    Referring Provider (PT)  Lars Mage, NP    Onset Date/Surgical Date  --   1 month ago    Next MD Visit  none unless needed    Prior Therapy  none       Precautions   Precautions  None      Balance Screen   Has the patient fallen in the past 6 months  No    Has the patient had a decrease in activity level because of a fear of falling?   No     Is the patient reluctant to leave their home because of a fear of falling?   No      Prior Function   Leisure  crafting/crocheting      Cognition   Overall Cognitive Status  Within Functional Limits for tasks assessed      AROM   Overall AROM Comments  lumbar flexion pain free, extension pain end range (+) tingling in the leg to the ankle, rotation Lt and Rt full and pain free     Right Hip External Rotation   25    Right Hip Internal Rotation   30    Left Hip External Rotation   25    Left Hip Internal Rotation   30      Strength   Overall Strength Comments  5/5 MMT Lt and Rt LE       Flexibility   Soft Tissue Assessment /Muscle Length  yes    Hamstrings  WNL    Quadriceps  WNL      Palpation   Palpation comment  tenderness Lt gluteals, piriformis;  L5, S1 tender      Special Tests    Special Tests  Hip Special Tests    Other special tests  (+) slump on Lt; passive straight leg raise (+)    Hip Special Tests   [facility] Test    Findings  Positive    Side  Left                   [facility] Adult PT Treatment/Exercise - 07/26/19 0001      Lumbar Exercises: Seated   Other Seated Lumbar Exercises  Lt piriformis stretch with sciatic nerve flossing x3 reps slow      Knee/Hip Exercises: Supine   Other Supine Knee/Hip Exercises  sciatic nerve flossing on Lt x8 reps HEP demo     Other Supine Knee/Hip Exercises  Lt piriformis stretch 3x10 sec hold              PT Education - 07/26/19 1417    Education Details  updates to HEP    Person(s) Educated  Patient    Methods  Explanation;Handout;Verbal cues    Comprehension  Verbalized understanding;Returned demonstration       PT Short Term Goals - 07/26/19 1116      PT SHORT TERM GOAL #1   Title  Pt will demo consistency with her initial HEP to decrease pain throughout the day.    Time  3    Period  Weeks    Status  Achieved    Target Date  06/07/19      PT SHORT TERM GOAL #2   Title  Pt  will be able to verbalize and demonstrate sleeping position adjustments to decrease direct pressure on the Lt hip.    Time  3    Period  Weeks    Status  Achieved        PT Long Term Goals - 07/26/19 1117      PT LONG TERM GOAL #1   Title  Pt will have greater than 35 deg of Lt hip active internal and external rotation.    Baseline  30deg    Time  6    Period  Weeks    Status  Partially Met      PT LONG TERM GOAL #2   Title  Pt will be able to sit for atleast 20 minutes without increase in Lt buttock pain which will allow her to complete crafting without the need for as many rest breaks.    Baseline  sometimes all day, sometimes less than 5 minutes    Time  6    Period  Weeks    Status  Partially Met      PT LONG TERM GOAL #3   Title  Pt will report atleast 50% improvement in her Lt buttock/shin pain from the start of PT which will improve her quality of life.    Baseline  unsure, pain changes    Time  6    Period  Weeks    Status  On-going      PT LONG TERM GOAL #4   Title  Pt will demo safe lifting mechanics, evident by her ability to lift a #10 box from the floor without increase in pain or excessive lumbar flexion.    Time  6    Period  Weeks    Status  On-going            Plan - 07/26/19  107    Clinical Impression Statement  Pt has made slow progress towards her goals since her most recent re-evaluation. Scheduling conflicts and the Christmas holiday made it difficult to maintain a steady schedule. Pt has met 2-3 of her goals since starting PT, and she demonstrates good understanding of log roll technique when getting in and out of bed. Pt has had improvements in her morning pain when getting out of the bed but her ability to maintain adequate trunk stability throughout the remainder of her day fluctuates during the week. Pt's strength in her LEs and flexibility is within normal limits, but she continues to present with Lt LE neural tension and painful lumbar  extension with intermittent Lt radicular symptoms. She would benefit from skilled PT to promote increase in trunk strength and stability, increase her posture/mechanics with daily activity and decrease her pain throughout the day.    PT Frequency  2x / week    PT Duration  6 weeks    PT Treatment/Interventions  ADLs/Self Care Home Management;Moist Heat;Cryotherapy;Therapeutic activities;Therapeutic exercise;Neuromuscular re-education;Manual techniques;Patient/family education;Passive range of motion;Dry needling;Taping;Aquatic Therapy;Traction    PT Next Visit Plan  manual to gluteals/lumbar spine as needed; progress trunk stability/strength; nerve flossing    PT Home Exercise Plan  Access Code: [email or web address]    Consulted and Agree with Plan of Care  Patient       Patient will benefit from skilled therapeutic intervention in order to improve the following deficits and impairments:  Decreased activity tolerance, Decreased strength, Decreased range of motion, Pain, Impaired flexibility, Obesity, Improper body mechanics, Increased muscle spasms  Visit Diagnosis: Chronic low back pain, unspecified back pain laterality, unspecified whether sciatica present  Pain in left hip  Muscle weakness (generalized)     Problem List There are no problems to display for this patient.  3:33 PM,07/26/19 Sherol Dade PT, [place] [facility] PHYSICAL THERAPY DISCHARGE SUMMARY  Visits from Start of Care: 7  Current functional level related to goals / functional outcomes: See above for current PT status.  Pt didn't return to PT.     Remaining deficits: See above.    Education / Equipment: HEP Plan: Patient agrees to discharge.  Patient goals were partially met. Patient is being discharged due to not returning since the last visit.  ?????        Sigurd Sos, PT 09/05/19 2:54 PM   [facility] [address], [place], [place], [address] Phone: [phone]   Fax:  [phone]  Name: MAESON LOURENCO MRN: [id number] Date of Birth: 10-19-1965

## 2019-07-31 ENCOUNTER — Ambulatory Visit: Payer: Medicaid Other

## 2019-08-02 ENCOUNTER — Ambulatory Visit: Payer: Medicaid Other

## 2019-08-08 ENCOUNTER — Encounter: Payer: Medicaid Other | Admitting: Physical Therapy

## 2019-08-21 ENCOUNTER — Encounter: Payer: Medicaid Other | Admitting: Physical Therapy

## 2020-02-11 ENCOUNTER — Other Ambulatory Visit: Payer: Self-pay | Admitting: Physician Assistant

## 2020-02-11 DIAGNOSIS — Z1231 Encounter for screening mammogram for malignant neoplasm of breast: Secondary | ICD-10-CM

## 2020-03-05 ENCOUNTER — Ambulatory Visit
Admission: RE | Admit: 2020-03-05 | Discharge: 2020-03-05 | Disposition: A | Discharge status: Home / Self Care | Payer: Medicaid Other | Source: Ambulatory Visit | Attending: Physician Assistant | Admitting: Physician Assistant

## 2020-03-05 ENCOUNTER — Other Ambulatory Visit: Payer: Self-pay

## 2020-03-05 DIAGNOSIS — Z1231 Encounter for screening mammogram for malignant neoplasm of breast: Secondary | ICD-10-CM

## 2020-03-05 IMAGING — MG DIGITAL SCREENING BILAT W/ TOMO W/ CAD
8 of 14 series · 8 of 40 positions shown · non-contrast
Comparison: Previous exam(s).

ACR Breast Density Category a: The breast tissue is almost entirely
fatty.

CLINICAL DATA: Screening.

EXAM:
DIGITAL SCREENING BILATERAL MAMMOGRAM WITH TOMO AND CAD

[R CC synth-2D]
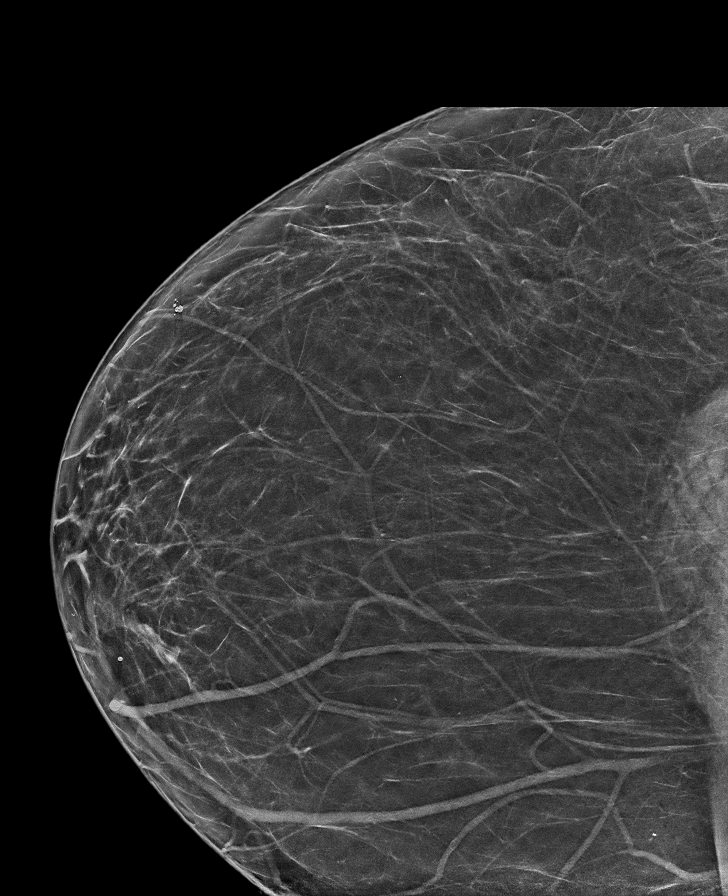

[L MLO synth-2D (1 of 2)]
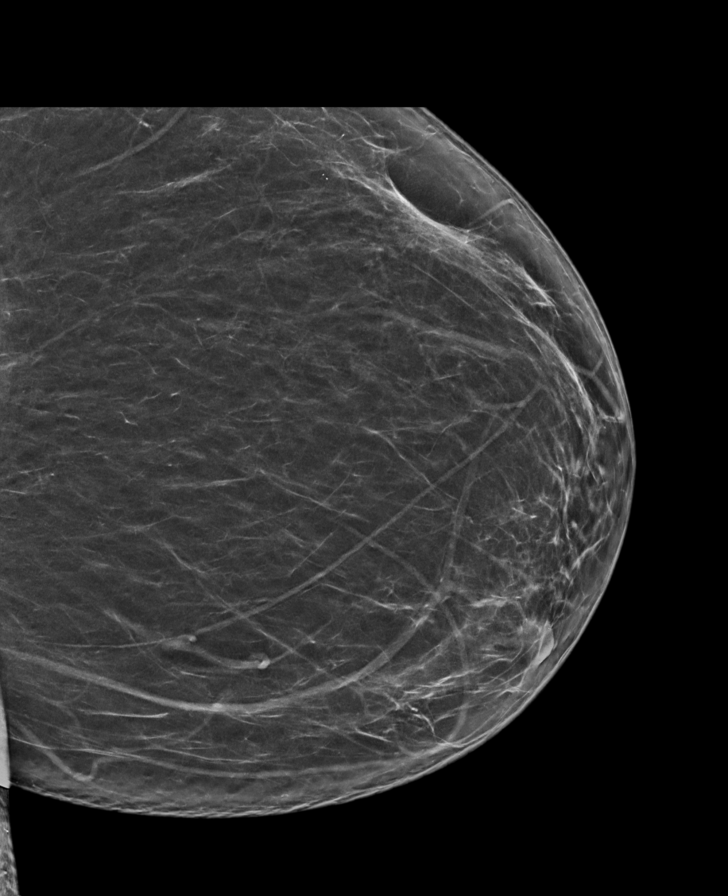

[R CV synth-2D]
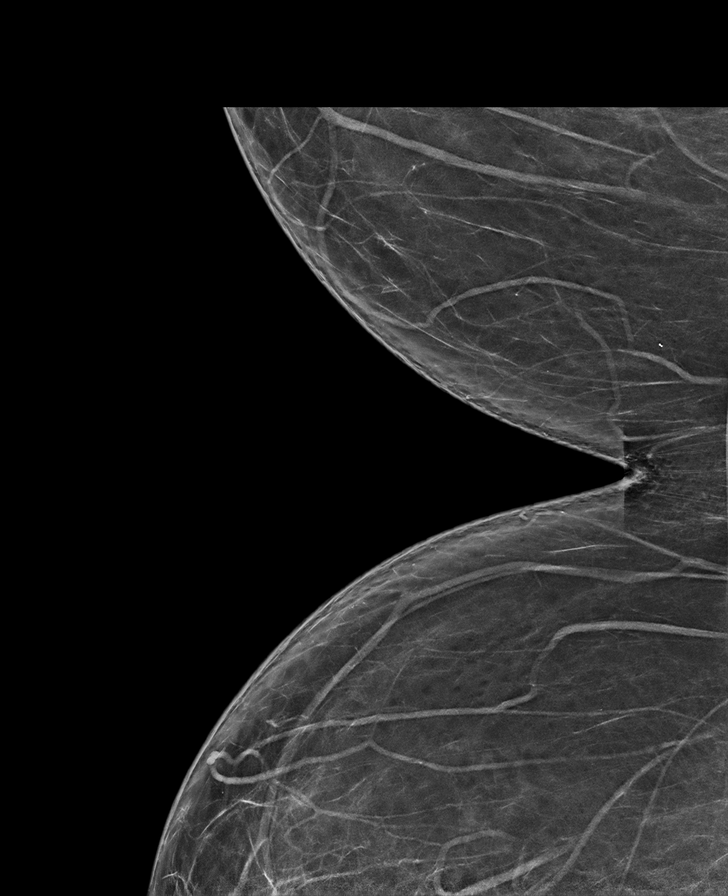

[L MLO synth-2D (2 of 2)]
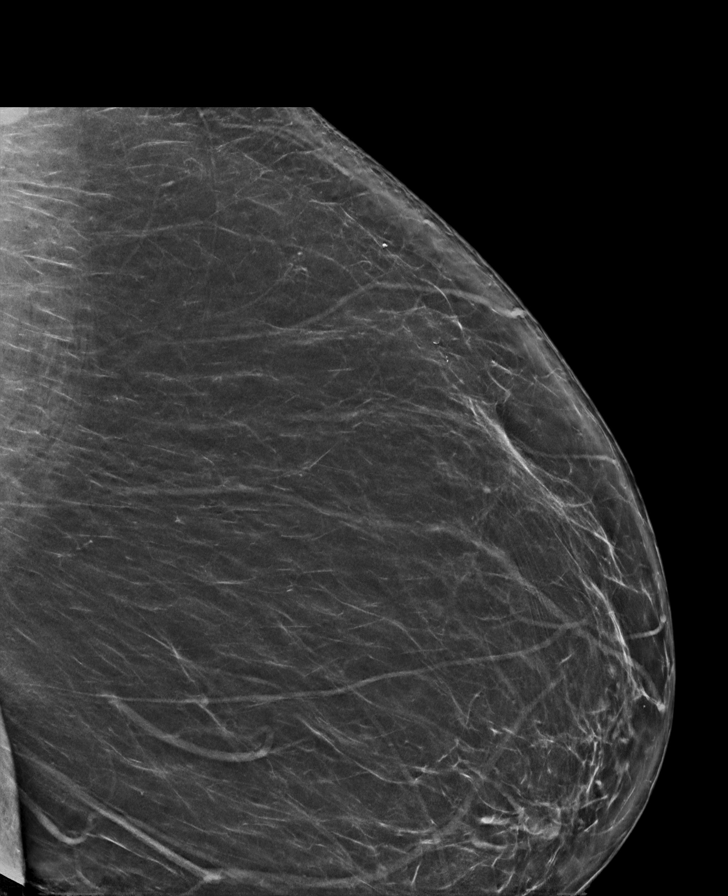

[R MLO synth-2D (1 of 2)]
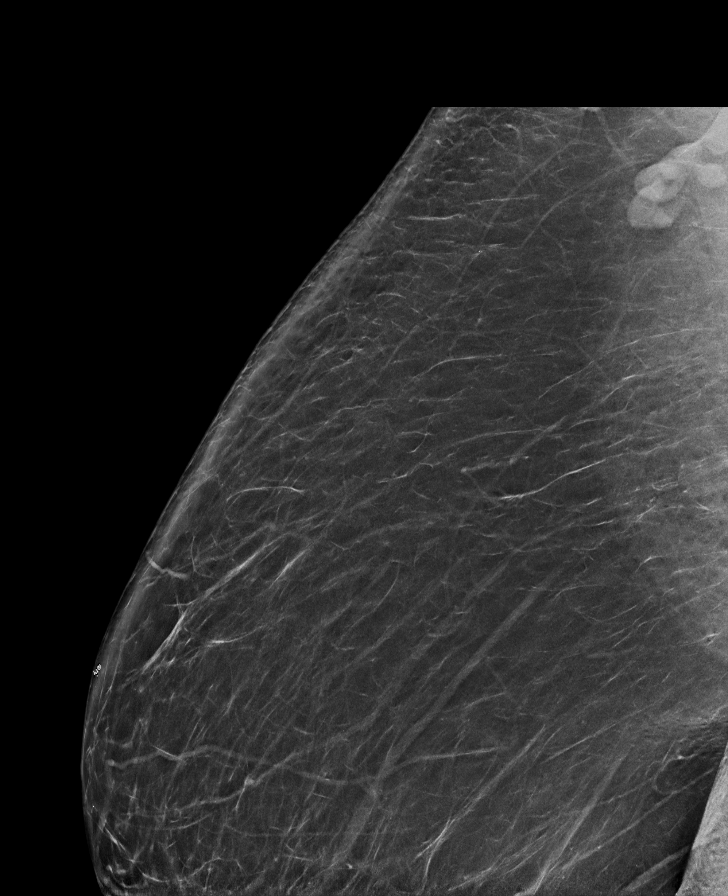

[R MLO synth-2D (2 of 2)]
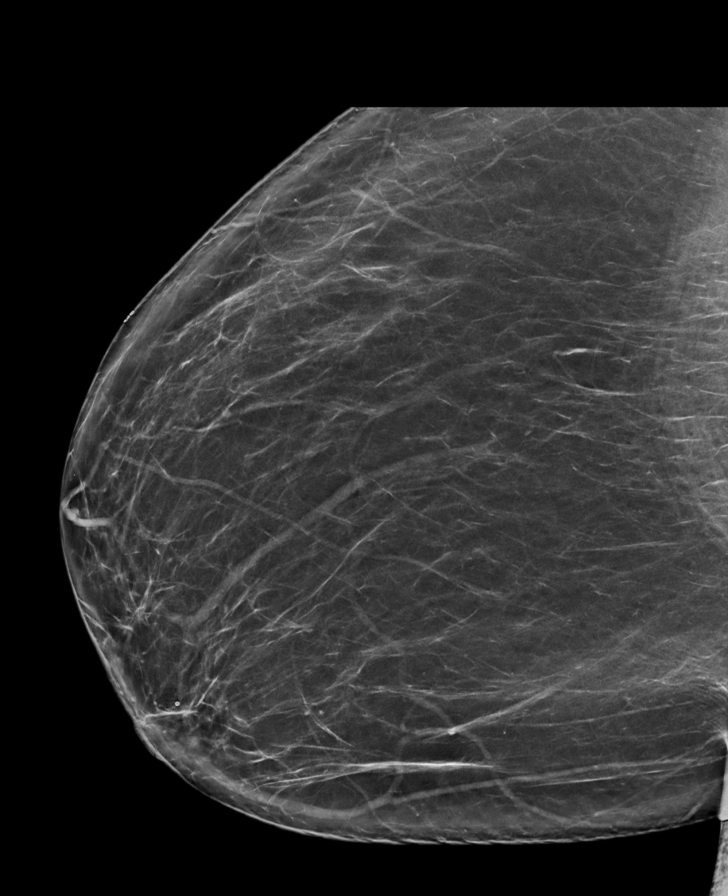

[L CC synth-2D]
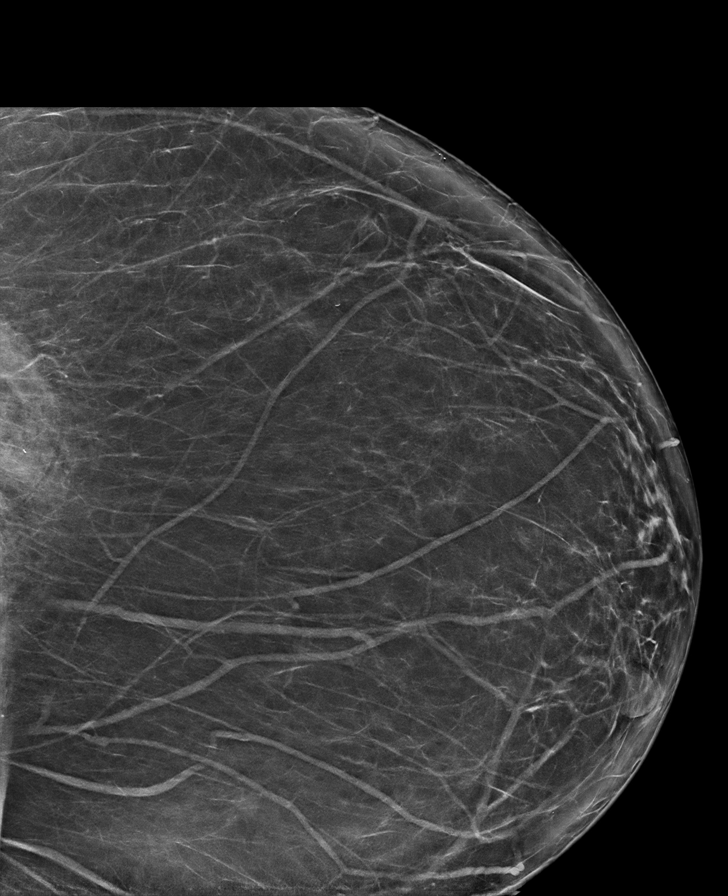

[R MLO tomo · tomo slice 46/91.0]
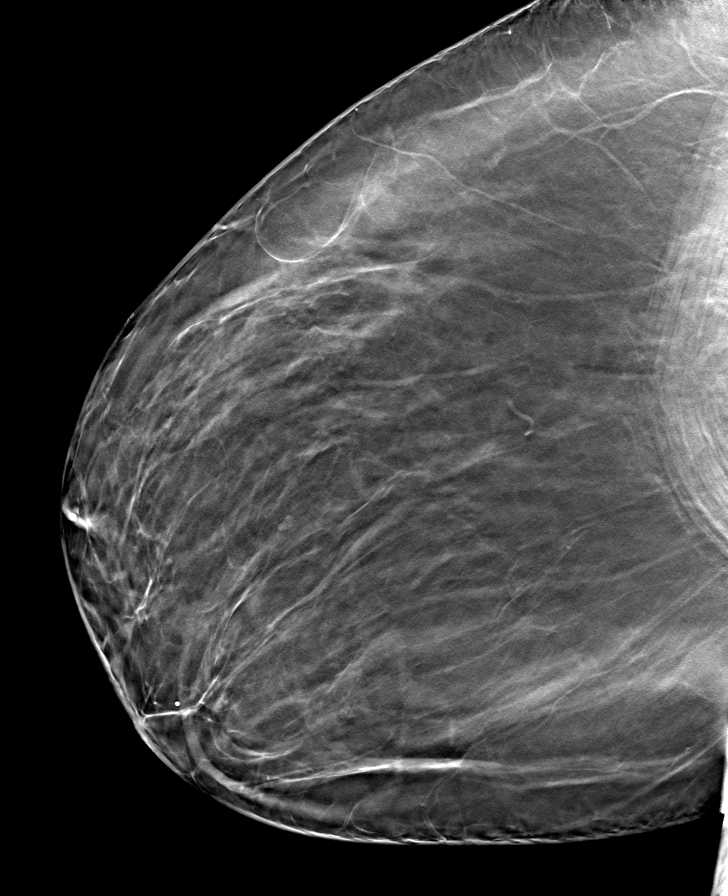

[8 of 40 positions shown; findings below may reference images not displayed]

FINDINGS: There are no findings suspicious for malignancy. Images were
processed with CAD.
IMPRESSION: No mammographic evidence of malignancy. A result letter of this
screening mammogram will be mailed directly to the patient.

RECOMMENDATION:
Screening mammogram in one year. (Code:[TA])

BI-RADS CATEGORY  1: Negative.

## 2020-10-07 ENCOUNTER — Other Ambulatory Visit: Payer: Self-pay | Admitting: Neurological Surgery

## 2020-10-07 DIAGNOSIS — M5416 Radiculopathy, lumbar region: Secondary | ICD-10-CM

## 2020-10-21 ENCOUNTER — Ambulatory Visit
Admission: RE | Admit: 2020-10-21 | Discharge: 2020-10-21 | Disposition: A | Discharge status: Home / Self Care | Payer: Medicaid Other | Source: Ambulatory Visit | Attending: Neurological Surgery | Admitting: Neurological Surgery

## 2020-10-21 ENCOUNTER — Other Ambulatory Visit: Payer: Self-pay

## 2020-10-21 DIAGNOSIS — M5416 Radiculopathy, lumbar region: Secondary | ICD-10-CM

## 2020-10-21 IMAGING — MR MR LUMBAR SPINE W/O CM
4 of 5 series · 25 of 48 positions shown · non-contrast
Comparison: [DATE] outside study images only

CLINICAL DATA: Lumbar radiculopathy; technologist note states low
back pain radiating to left extremity

EXAM:
MRI LUMBAR SPINE WITHOUT CONTRAST
TECHNIQUE: Multiplanar, multisequence MR imaging of the lumbar spine was
performed. No intravenous contrast was administered.

[Series 5: T2 · sagittal · 4.0mm · 0.88mm/px · 6 of 16 slices shown (1 of 2)]
[im 1/16]
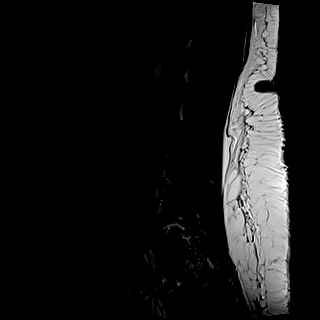
[im 4/16]
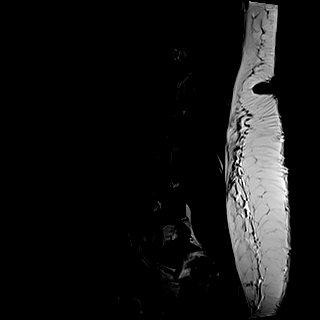
[im 7/16]
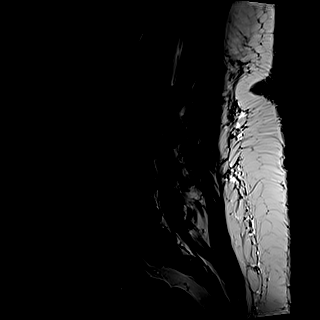
[im 10/16]
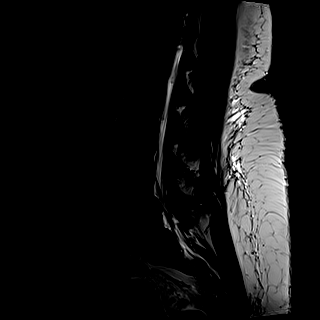
[im 13/16]
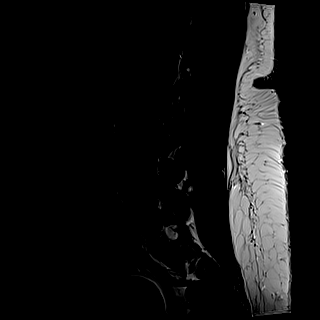
[im 16/16]
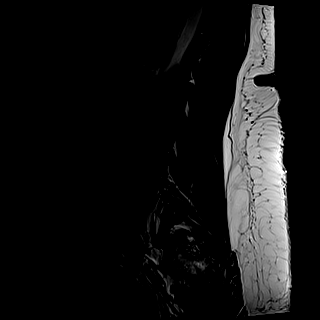

[Series 6: T1 · sagittal · 4.0mm · 0.88mm/px · 6 of 16 slices shown (1 of 2)]
[im 1/16]
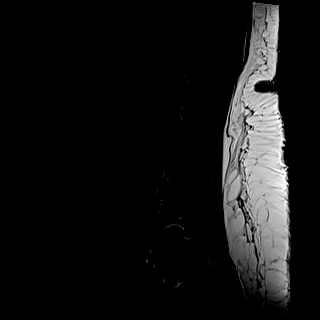
[im 4/16]
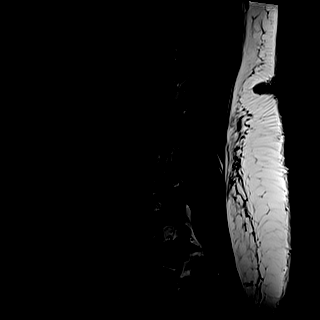
[im 7/16]
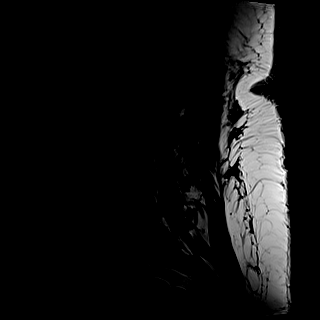
[im 10/16]
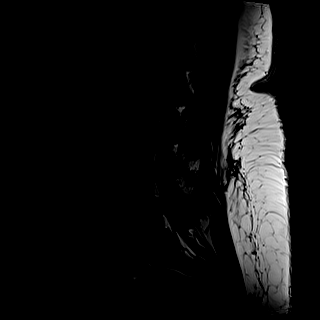
[im 13/16]
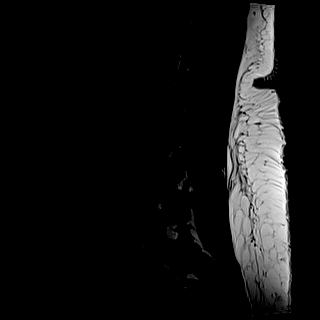
[im 16/16]
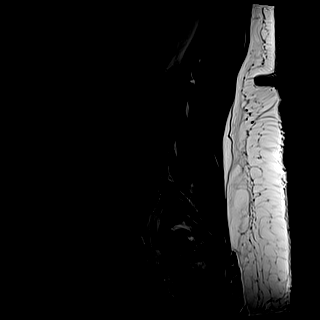

[Series 10: T1 · axial · 4.0mm · 0.35mm/px · z∈[-82,+100]mm · 4 of 41 slices shown (2 of 2)]
[im 1/41]
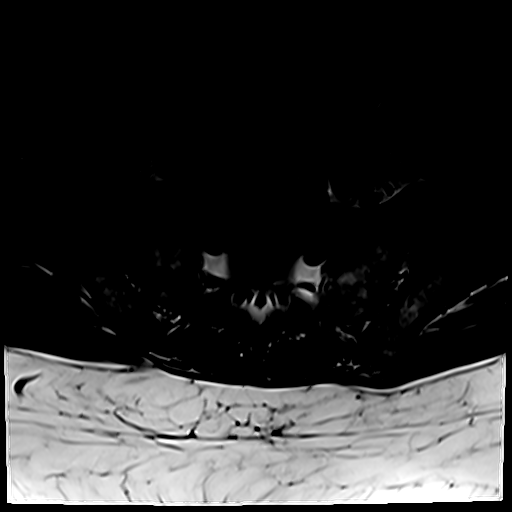
[im 6/41]
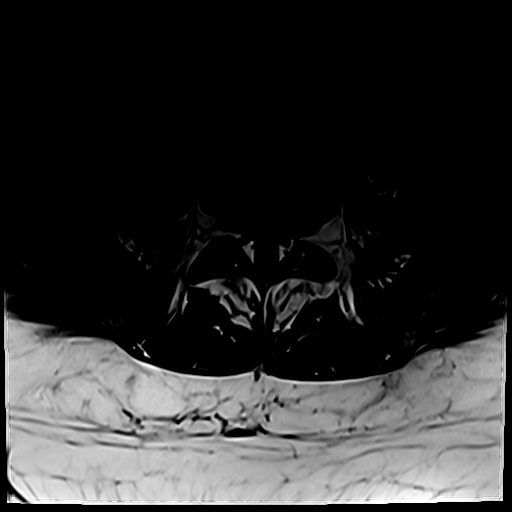
[im 21/41]
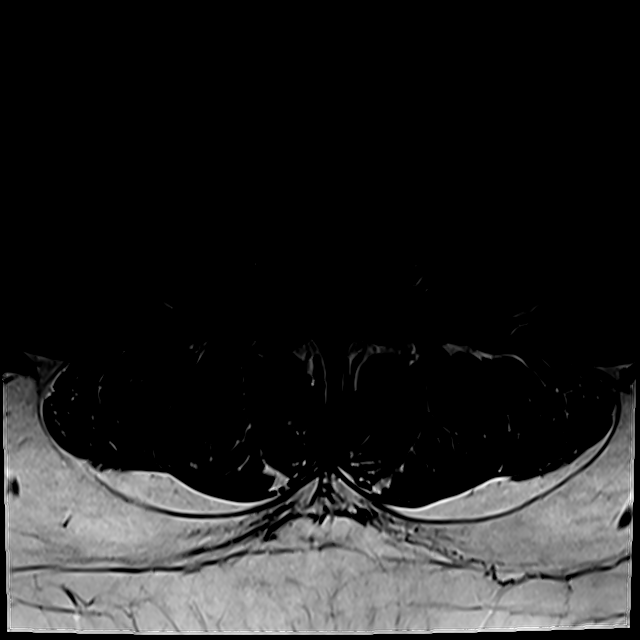
[im 35/41]
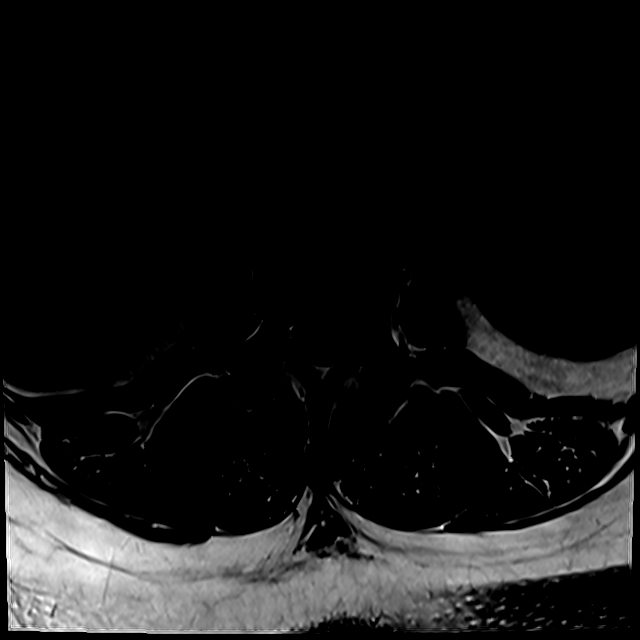

[Series 13: T2 · axial · 4.0mm · 0.35mm/px · z∈[-82,+129]mm · 9 of 41 slices shown (2 of 2)]
[im 1/41]
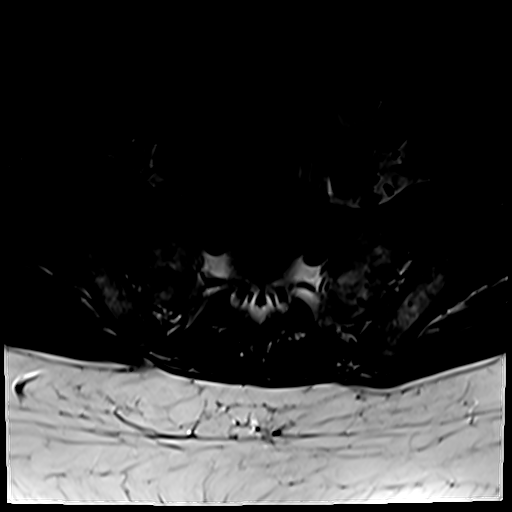
[im 6/41]
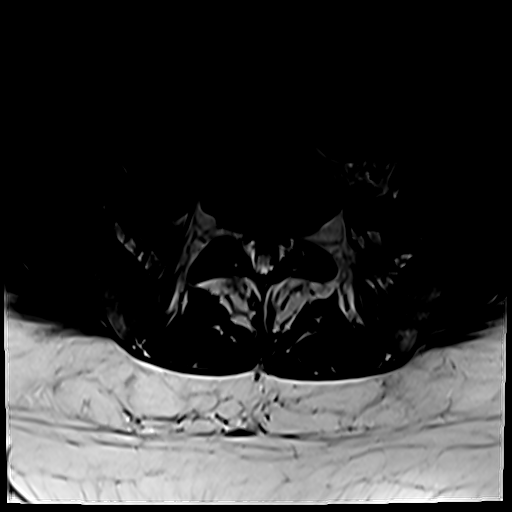
[im 12/41]
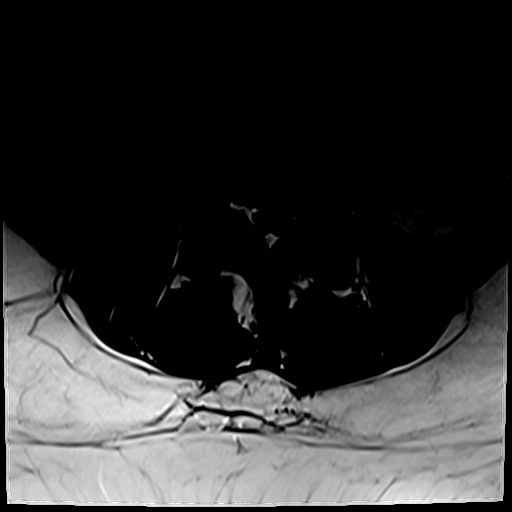
[im 18/41]
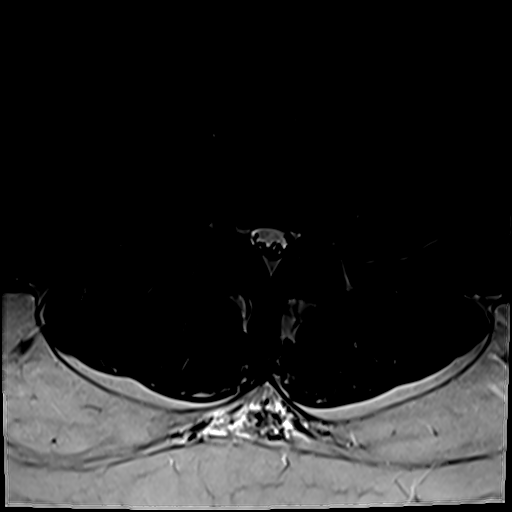
[im 21/41]
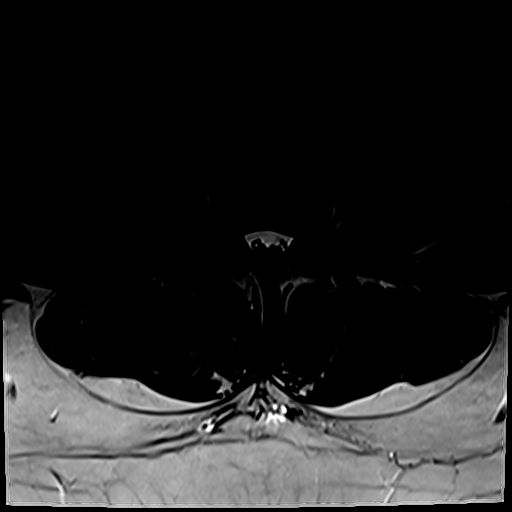
[im 23/41]
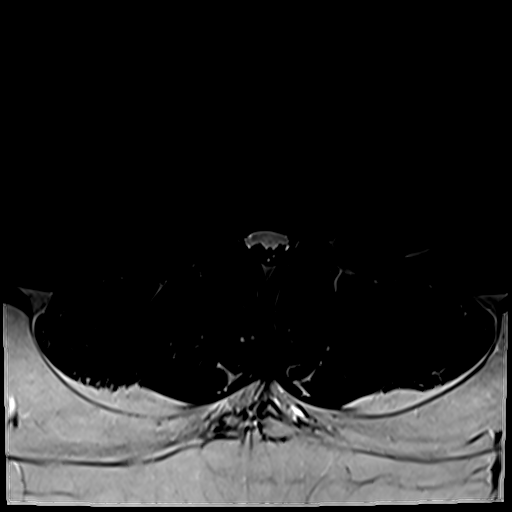
[im 29/41]
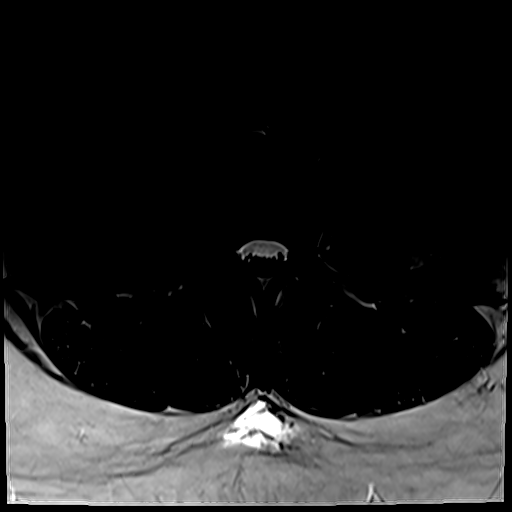
[im 35/41]
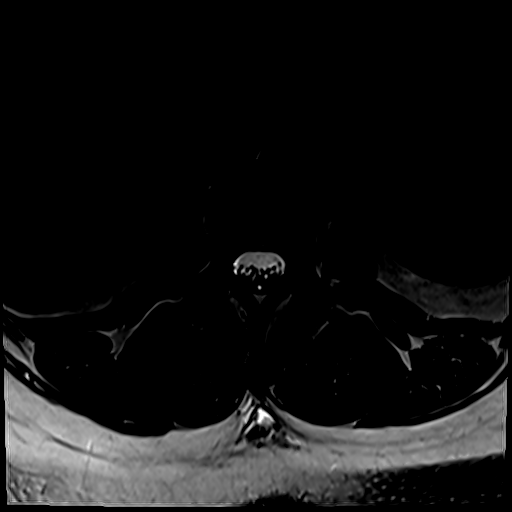
[im 41/41]
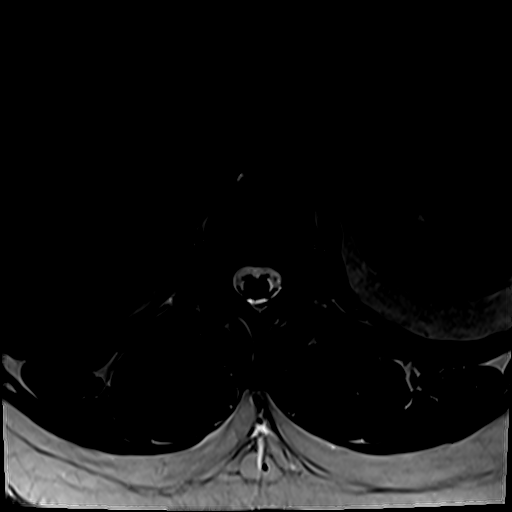

[25 of 48 positions shown; findings below may reference images not displayed]

FINDINGS: Segmentation:  Standard.

Alignment:  Preserved.

Vertebrae: Vertebral body heights are preserved. There is no
substantial marrow edema. No suspicious osseous lesion.

Conus medullaris and cauda equina: Conus extends to the L1 level.
Conus and cauda equina appear normal.

Paraspinal and other soft tissues: Unremarkable.

Disc levels:

L1-L2:  No canal or foraminal stenosis.

L2-L3:  No canal or foraminal stenosis.

L3-L4: Small left foraminal protrusion. Mild facet arthropathy. No
canal or right foraminal stenosis. Mild left foraminal stenosis.

L4-L5: Left central/subarticular disc protrusion. Mild right and
moderate left facet arthropathy with ligamentum flavum infolding.
Prominent epidural fat partially effacing the thecal sac. Narrowing
of the left subarticular recess with compression of traversing L5
nerve roots. No right foraminal stenosis. Minor left foraminal
stenosis.

L5-S1:  No canal or foraminal stenosis.
IMPRESSION: Multilevel degenerative changes as detailed above. Most notably, a
disc herniation at L4-L5 compresses the traversing left L5 nerve
roots.

## 2020-10-28 ENCOUNTER — Other Ambulatory Visit: Payer: Self-pay | Admitting: Neurological Surgery

## 2020-10-28 DIAGNOSIS — M5126 Other intervertebral disc displacement, lumbar region: Secondary | ICD-10-CM

## 2020-12-23 NOTE — Progress Notes (Signed)
Surgical Instructions    Your procedure is scheduled on 12/26/20.  Report to [facility] Main Entrance "A" at 09:18 A.M., then check in with the Admitting office.  Call this number if you have problems the morning of surgery:  [phone]   If you have any questions prior to your surgery date call [phone]: Open Monday-Friday 8am-4pm    Remember:  Do not eat or drink after midnight the night before your surgery      Take these medicines the morning of surgery with A SIP OF WATER  amLODipine (NORVASC) buPROPion (WELLBUTRIN XL)  pantoprazole (PROTONIX)    As of today, STOP taking any Aspirin (unless otherwise instructed by your surgeon) Aleve, Naproxen, Ibuprofen, Motrin, Advil, Goody's, BC's, all herbal medications, fish oil, diclofenac Sodium (VOLTAREN) gel and all vitamins.  WHAT DO I DO ABOUT MY DIABETES MEDICATION?   Do not take oral diabetes medicines (pills) the morning of surgery.      THE MORNING OF SURGERY, do not take sitaGLIPtin-metformin (JANUMET).  The day of surgery, do not take other diabetes injectables, including Byetta (exenatide), Bydureon (exenatide ER), Victoza (liraglutide), or Trulicity (dulaglutide).  If your CBG is greater than 220 mg/dL, you may take  of your sliding scale (correction) dose of insulin.   HOW TO MANAGE YOUR DIABETES BEFORE AND AFTER SURGERY  Why is it important to control my blood sugar before and after surgery? Improving blood sugar levels before and after surgery helps healing and can limit problems. A way of improving blood sugar control is eating a healthy diet by:  Eating less sugar and carbohydrates  Increasing activity/exercise  Talking with your doctor about reaching your blood sugar goals High blood sugars (greater than 180 mg/dL) can raise your risk of infections and slow your recovery, so you will need to focus on controlling your diabetes during the weeks before surgery. Make sure that the doctor who takes care of  your diabetes knows about your planned surgery including the date and location.  How do I manage my blood sugar before surgery? Check your blood sugar at least 4 times a day, starting 2 days before surgery, to make sure that the level is not too high or low.  Check your blood sugar the morning of your surgery when you wake up and every 2 hours until you get to the Short Stay unit.  If your blood sugar is less than 70 mg/dL, you will need to treat for low blood sugar: Do not take insulin. Treat a low blood sugar (less than 70 mg/dL) with  cup of clear juice (cranberry or apple), 4 glucose tablets, OR glucose gel. Recheck blood sugar in 15 minutes after treatment (to make sure it is greater than 70 mg/dL). If your blood sugar is not greater than 70 mg/dL on recheck, call [phone] for further instructions. Report your blood sugar to the short stay nurse when you get to Short Stay.  If you are admitted to the hospital after surgery: Your blood sugar will be checked by the staff and you will probably be given insulin after surgery (instead of oral diabetes medicines) to make sure you have good blood sugar levels. The goal for blood sugar control after surgery is 80-180 mg/dL.           Do not wear jewelry or makeup Do not wear lotions, powders, perfumes/colognes, or deodorant. Do not shave 48 hours prior to surgery.  Men may shave face and neck. Do not bring valuables to the  hospital. DO Not wear nail polish, gel polish, artificial nails, or any other type of covering on  natural nails including finger and toenails. If patients have artificial nails, gel coating, etc. that need to be removed by a nail salon please have this removed prior to surgery or surgery may need to be canceled/delayed if the surgeon/ anesthesia feels like the patient is unable to be adequately monitored.             [facility] is not responsible for any belongings or valuables.  Do NOT Smoke (Tobacco/Vaping) or  drink Alcohol 24 hours prior to your procedure If you use a CPAP at night, you may bring all equipment for your overnight stay.   Contacts, glasses, dentures or bridgework may not be worn into surgery, please bring cases for these belongings   For patients admitted to the hospital, discharge time will be determined by your treatment team.   Patients discharged the day of surgery will not be allowed to drive home, and someone needs to stay with them for 24 hours.  ONLY 1 SUPPORT PERSON MAY BE PRESENT WHILE YOU ARE IN SURGERY. IF YOU ARE TO BE ADMITTED ONCE YOU ARE IN YOUR ROOM YOU WILL BE ALLOWED TWO (2) VISITORS.  Minor children may have two parents present. Special consideration for safety and communication needs will be reviewed on a case by case basis.  Special instructions:    Oral Hygiene is also important to reduce your risk of infection.  Remember - BRUSH YOUR TEETH THE MORNING OF SURGERY WITH YOUR REGULAR TOOTHPASTE   [place]- Preparing For Surgery  Before surgery, you can play an important role. Because skin is not sterile, your skin needs to be as free of germs as possible. You can reduce the number of germs on your skin by washing with CHG (chlorahexidine gluconate) Soap before surgery.  CHG is an antiseptic cleaner which kills germs and bonds with the skin to continue killing germs even after washing.     Please do not use if you have an allergy to CHG or antibacterial soaps. If your skin becomes reddened/irritated stop using the CHG.  Do not shave (including legs and underarms) for at least 48 hours prior to first CHG shower. It is OK to shave your face.  Please follow these instructions carefully.     Shower the NIGHT BEFORE SURGERY and the MORNING OF SURGERY with CHG Soap.   If you chose to wash your hair, wash your hair first as usual with your normal shampoo. After you shampoo, rinse your hair and body thoroughly to remove the shampoo.  Then [employer] and genitals  (private parts) with your normal soap and rinse thoroughly to remove soap.  After that Use CHG Soap as you would any other liquid soap. You can apply CHG directly to the skin and wash gently with a scrungie or a clean washcloth.   Apply the CHG Soap to your body ONLY FROM THE NECK DOWN.  Do not use on open wounds or open sores. Avoid contact with your eyes, ears, mouth and genitals (private parts). Wash Face and genitals (private parts)  with your normal soap.   Wash thoroughly, paying special attention to the area where your surgery will be performed.  Thoroughly rinse your body with warm water from the neck down.  DO NOT shower/wash with your normal soap after using and rinsing off the CHG Soap.  Pat yourself dry with a CLEAN TOWEL.  Wear CLEAN  PAJAMAS to bed the night before surgery  Place CLEAN SHEETS on your bed the night before your surgery  DO NOT SLEEP WITH PETS.   Day of Surgery:  Take a shower with CHG soap. Wear Clean/Comfortable clothing the morning of surgery Do not apply any deodorants/lotions.   Remember to brush your teeth WITH YOUR REGULAR TOOTHPASTE.   Please read over the following fact sheets that you were given.

## 2020-12-24 ENCOUNTER — Other Ambulatory Visit: Payer: Self-pay

## 2020-12-24 ENCOUNTER — Encounter (HOSPITAL_COMMUNITY): Payer: Self-pay

## 2020-12-24 ENCOUNTER — Ambulatory Visit (HOSPITAL_COMMUNITY)
Admission: RE | Admit: 2020-12-24 | Discharge: 2020-12-24 | Disposition: A | Discharge status: Home / Self Care | Payer: Medicaid Other | Source: Ambulatory Visit | Attending: Neurological Surgery | Admitting: Neurological Surgery

## 2020-12-24 ENCOUNTER — Encounter (HOSPITAL_COMMUNITY)
Admission: RE | Admit: 2020-12-24 | Discharge: 2020-12-24 | Disposition: A | Discharge status: Home / Self Care | Payer: Medicaid Other | Source: Ambulatory Visit | Attending: Neurological Surgery | Admitting: Neurological Surgery

## 2020-12-24 DIAGNOSIS — M5126 Other intervertebral disc displacement, lumbar region: Secondary | ICD-10-CM | POA: Insufficient documentation

## 2020-12-24 DIAGNOSIS — Z20822 Contact with and (suspected) exposure to covid-19: Secondary | ICD-10-CM | POA: Insufficient documentation

## 2020-12-24 DIAGNOSIS — Z01818 Encounter for other preprocedural examination: Secondary | ICD-10-CM | POA: Diagnosis not present

## 2020-12-24 HISTORY — DX: Endometriosis, unspecified: N80.9

## 2020-12-24 HISTORY — DX: Gastro-esophageal reflux disease without esophagitis: K21.9

## 2020-12-24 HISTORY — DX: Anxiety disorder, unspecified: F41.9

## 2020-12-24 HISTORY — DX: Personal history of other diseases of the digestive system: Z87.19

## 2020-12-24 HISTORY — DX: Depression, unspecified: F32.A

## 2020-12-24 LAB — BASIC METABOLIC PANEL
Anion gap: 8 (ref 5–15)
BUN: 10 mg/dL (ref 6–20)
CO2: 27 mmol/L (ref 22–32)
Calcium: 9 mg/dL (ref 8.9–10.3)
Chloride: 103 mmol/L (ref 98–111)
Creatinine, Ser: 0.99 mg/dL (ref 0.44–1.00)
GFR, Estimated: >60 mL/min (ref >60)
Glucose, Bld: 152 mg/dL — ABNORMAL HIGH (ref 70–99)
Potassium: 3.2 mmol/L — ABNORMAL LOW (ref 3.5–5.1)
Sodium: 138 mmol/L (ref 135–145)

## 2020-12-24 LAB — CBC WITH DIFFERENTIAL/PLATELET
Abs Immature Granulocytes: 0.02 10*3/uL (ref 0.00–0.07)
Basophils Absolute: 0 10*3/uL (ref 0.0–0.1)
Basophils Relative: 0 %
Eosinophils Absolute: 0.3 10*3/uL (ref 0.0–0.5)
Eosinophils Relative: 3 %
HCT: 38.4 % (ref 36.0–46.0)
Hemoglobin: 12.1 g/dL (ref 12.0–15.0)
Immature Granulocytes: 0 %
Lymphocytes Relative: 34 %
Lymphs Abs: 2.7 10*3/uL (ref 0.7–4.0)
MCH: 24.6 pg — ABNORMAL LOW (ref 26.0–34.0)
MCHC: 31.5 g/dL (ref 30.0–36.0)
MCV: 78.2 fL — ABNORMAL LOW (ref 80.0–100.0)
Monocytes Absolute: 0.5 10*3/uL (ref 0.1–1.0)
Monocytes Relative: 6 %
Neutro Abs: 4.5 10*3/uL (ref 1.7–7.7)
Neutrophils Relative %: 57 %
Platelets: 390 10*3/uL (ref 150–400)
RBC: 4.91 MIL/uL (ref 3.87–5.11)
RDW: 16.4 % — ABNORMAL HIGH (ref 11.5–15.5)
WBC: 8 10*3/uL (ref 4.0–10.5)
nRBC: 0 % (ref 0.0–0.2)

## 2020-12-24 LAB — GLUCOSE, CAPILLARY: Glucose-Capillary: 168 mg/dL — ABNORMAL HIGH (ref 70–99)

## 2020-12-24 LAB — SURGICAL PCR SCREEN
MRSA, PCR: NEGATIVE
Staphylococcus aureus: NEGATIVE

## 2020-12-24 LAB — SARS CORONAVIRUS 2 (TAT 6-24 HRS): SARS Coronavirus 2: NEGATIVE

## 2020-12-24 LAB — HEMOGLOBIN A1C
Hgb A1c MFr Bld: 7.4 % — ABNORMAL HIGH (ref 4.8–5.6)
Mean Plasma Glucose: 165.68 mg/dL

## 2020-12-24 LAB — PROTIME-INR
INR: 1 (ref 0.8–1.2)
Prothrombin Time: 13 seconds (ref 11.4–15.2)

## 2020-12-24 IMAGING — CR DG CHEST 2V
2 series · 2 of 2 positions shown · non-contrast
Comparison: None.

CLINICAL DATA: Preop for back surgery.

EXAM:
CHEST - 2 VIEW

[w chest pa]
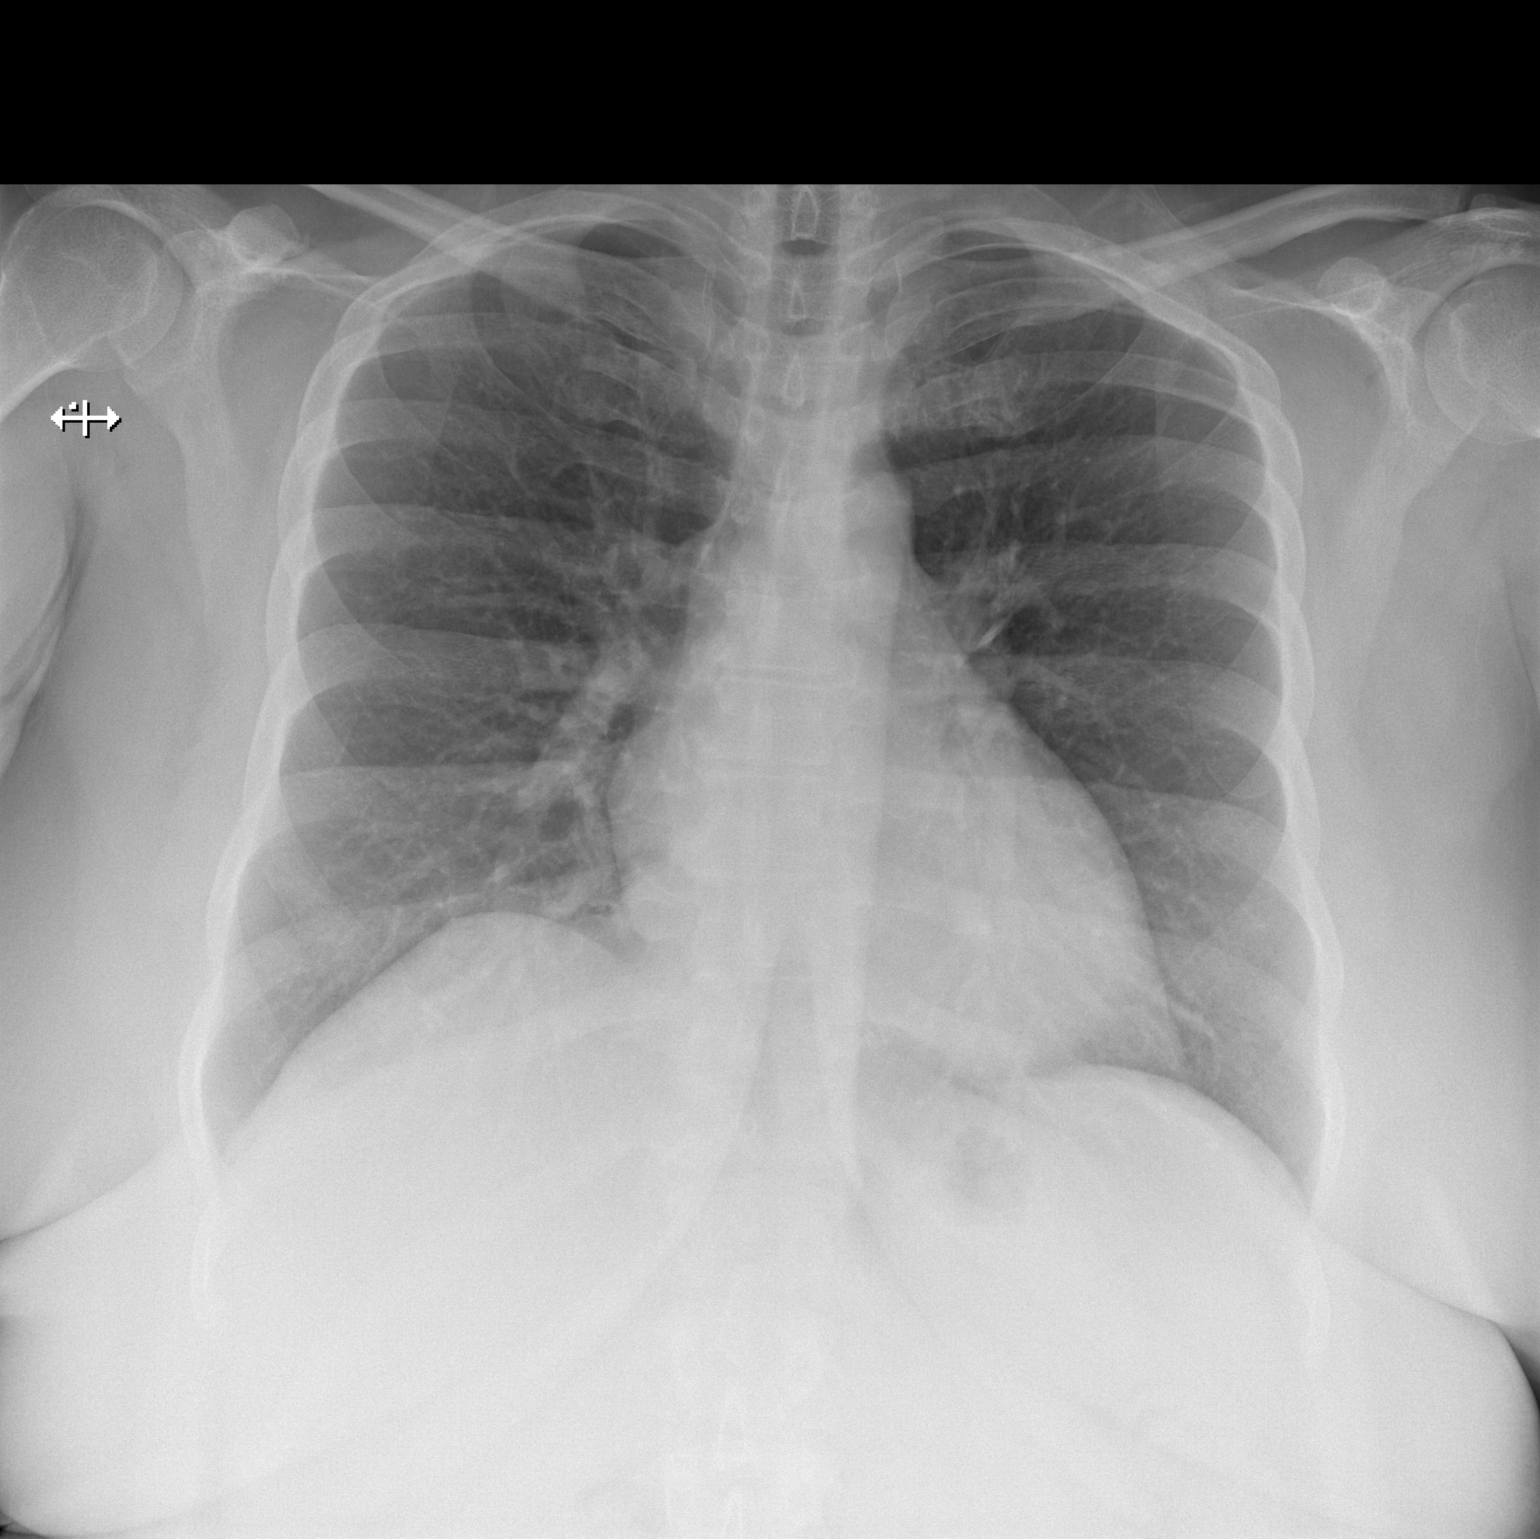

[w chest lat]
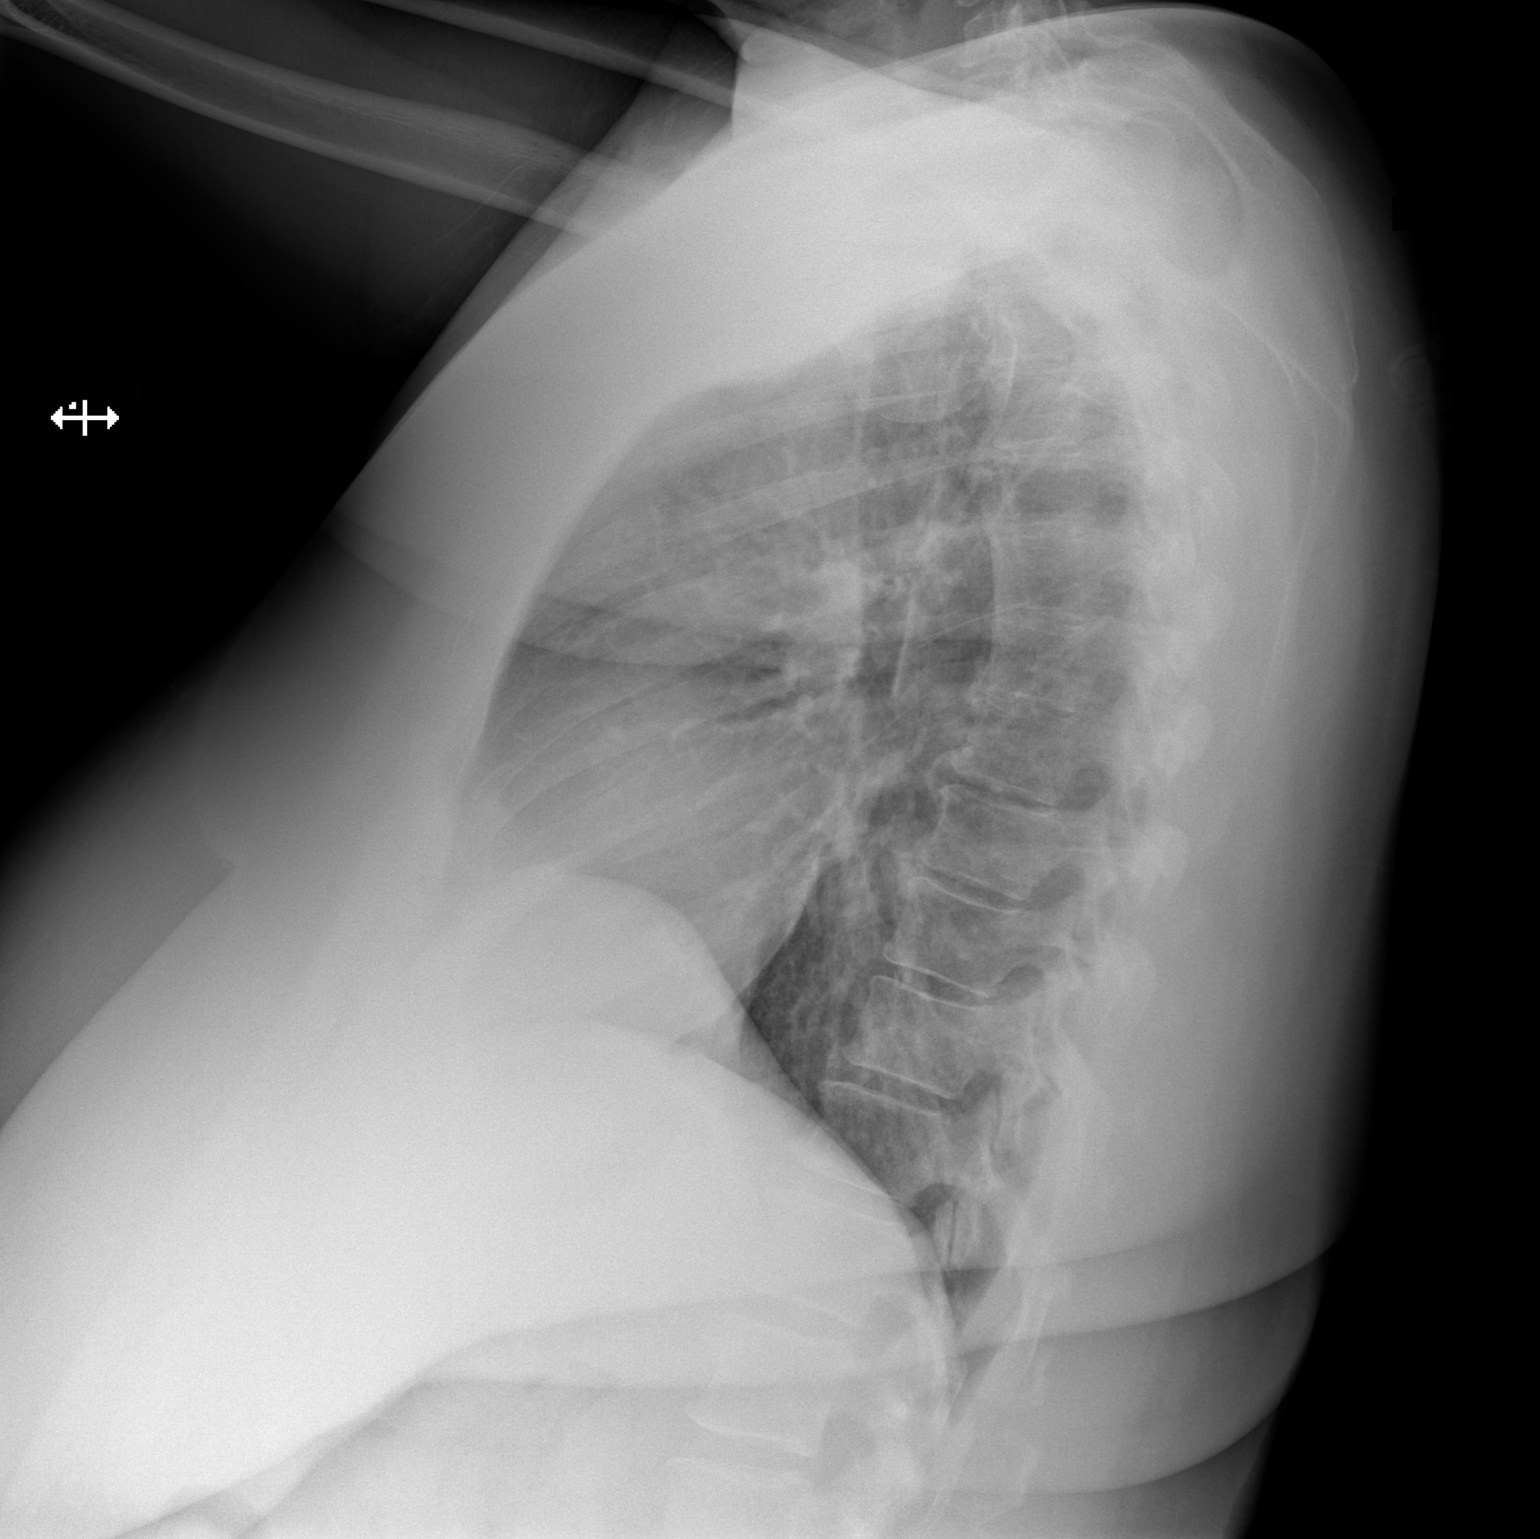

[2 of 2 positions shown; findings below may reference images not displayed]

FINDINGS: The cardiac silhouette, mediastinal and hilar contours are within
normal limits. The lungs are clear of an acute process. No pulmonary
lesions or pleural effusions. The bony thorax is intact.
IMPRESSION: Normal chest x-ray.

## 2020-12-24 NOTE — Progress Notes (Signed)
PCP - Ralene Ok Cardiologist - n/a  PPM/ICD - denies   Chest x-ray - 12/24/20 EKG - 12/24/20 Stress Test - n/a ECHO - n/a Cardiac Cath -n/a   Sleep Study - yes but cpap not needed   Fasting Blood Sugar - 100-140 Checks Blood Sugar twice a month  As of today, STOP taking any Aspirin (unless otherwise instructed by your surgeon) Aleve, Naproxen, Ibuprofen, Motrin, Advil, Goody's, BC's, all herbal medications, fish oil, diclofenac Sodium (VOLTAREN) gel and all vitamins.  ERAS Protcol -no   COVID TEST- 12/24/20 in PAT   Anesthesia review: no  Patient denies shortness of breath, fever, cough and chest pain at PAT appointment   All instructions explained to the patient, with a verbal understanding of the material. Patient agrees to go over the instructions while at home for a better understanding. Patient also instructed to self quarantine after being tested for COVID-19. The opportunity to ask questions was provided.

## 2020-12-26 ENCOUNTER — Other Ambulatory Visit: Payer: Self-pay

## 2020-12-26 ENCOUNTER — Observation Stay (HOSPITAL_COMMUNITY)
Admission: RE | Admit: 2020-12-26 | Discharge: 2020-12-27 | Disposition: A | Discharge status: Home / Self Care | Payer: Medicaid Other | Attending: Neurological Surgery | Admitting: Neurological Surgery

## 2020-12-26 ENCOUNTER — Ambulatory Visit (HOSPITAL_COMMUNITY): Payer: Medicaid Other | Admitting: Certified Registered Nurse Anesthetist

## 2020-12-26 ENCOUNTER — Encounter (HOSPITAL_COMMUNITY)
Admission: RE | Disposition: A | Discharge status: Home / Self Care | Payer: Self-pay | Source: Home / Self Care | Attending: Neurological Surgery

## 2020-12-26 ENCOUNTER — Encounter (HOSPITAL_COMMUNITY): Payer: Self-pay | Admitting: Neurological Surgery

## 2020-12-26 ENCOUNTER — Ambulatory Visit (HOSPITAL_COMMUNITY): Payer: Medicaid Other

## 2020-12-26 DIAGNOSIS — Z419 Encounter for procedure for purposes other than remedying health state, unspecified: Secondary | ICD-10-CM

## 2020-12-26 DIAGNOSIS — M5116 Intervertebral disc disorders with radiculopathy, lumbar region: Principal | ICD-10-CM | POA: Insufficient documentation

## 2020-12-26 DIAGNOSIS — Z79899 Other long term (current) drug therapy: Secondary | ICD-10-CM | POA: Diagnosis not present

## 2020-12-26 DIAGNOSIS — I10 Essential (primary) hypertension: Secondary | ICD-10-CM | POA: Diagnosis not present

## 2020-12-26 DIAGNOSIS — E119 Type 2 diabetes mellitus without complications: Secondary | ICD-10-CM | POA: Insufficient documentation

## 2020-12-26 DIAGNOSIS — M79605 Pain in left leg: Secondary | ICD-10-CM | POA: Diagnosis present

## 2020-12-26 DIAGNOSIS — M48061 Spinal stenosis, lumbar region without neurogenic claudication: Secondary | ICD-10-CM | POA: Diagnosis not present

## 2020-12-26 DIAGNOSIS — Z9889 Other specified postprocedural states: Secondary | ICD-10-CM

## 2020-12-26 HISTORY — PX: LUMBAR LAMINECTOMY/DECOMPRESSION MICRODISCECTOMY: SHX5026

## 2020-12-26 LAB — GLUCOSE, CAPILLARY
Glucose-Capillary: 142 mg/dL — ABNORMAL HIGH (ref 70–99)
Glucose-Capillary: 131 mg/dL — ABNORMAL HIGH (ref 70–99)
Glucose-Capillary: 114 mg/dL — ABNORMAL HIGH (ref 70–99)
Glucose-Capillary: 177 mg/dL — ABNORMAL HIGH (ref 70–99)
Glucose-Capillary: 220 mg/dL — ABNORMAL HIGH (ref 70–99)

## 2020-12-26 IMAGING — CR DG LUMBAR SPINE 2-3V
2 series · 2 of 2 positions shown · non-contrast
Comparison: MR imaging [DATE], outside radiograph [DATE]

CLINICAL DATA: Intraoperative imaging, laminectomy and foraminotomy
with discectomy L4-5, intraoperative imaging

EXAM:
LUMBAR SPINE - 2-3 VIEW

[lateral (1 of 2)]
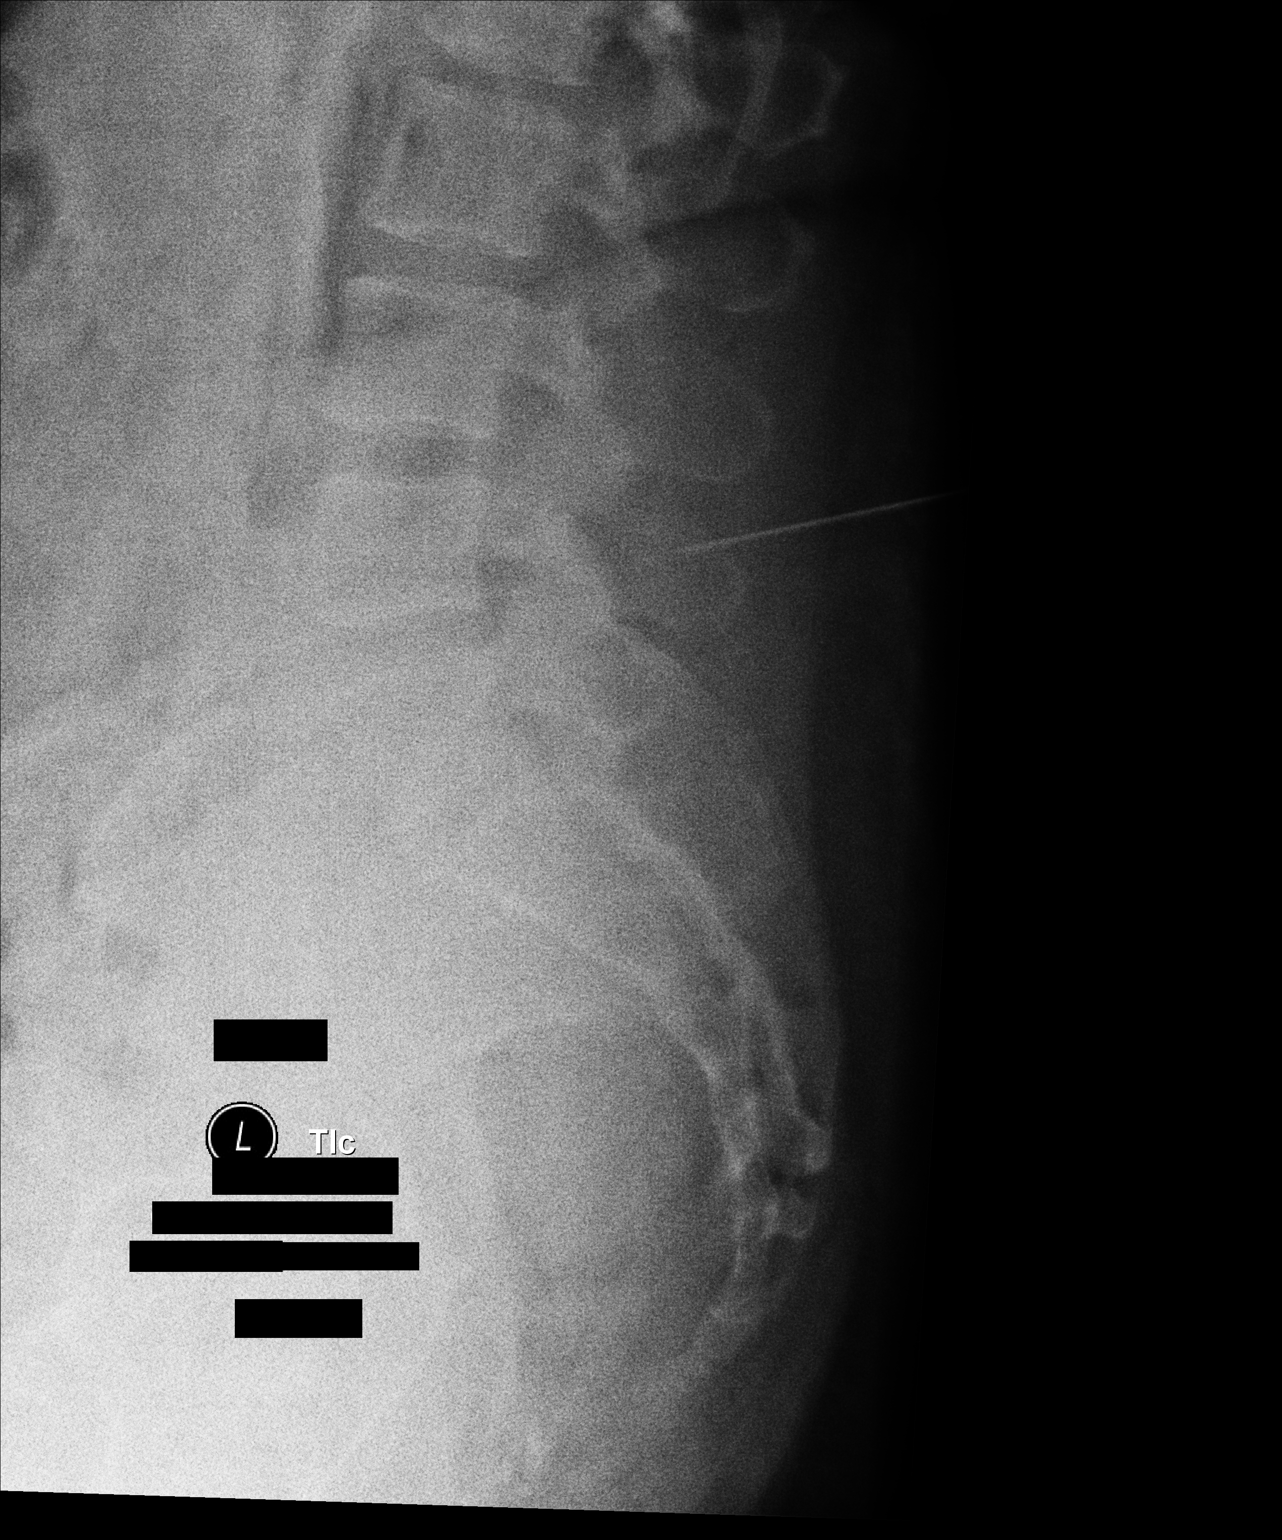

[lateral (2 of 2)]
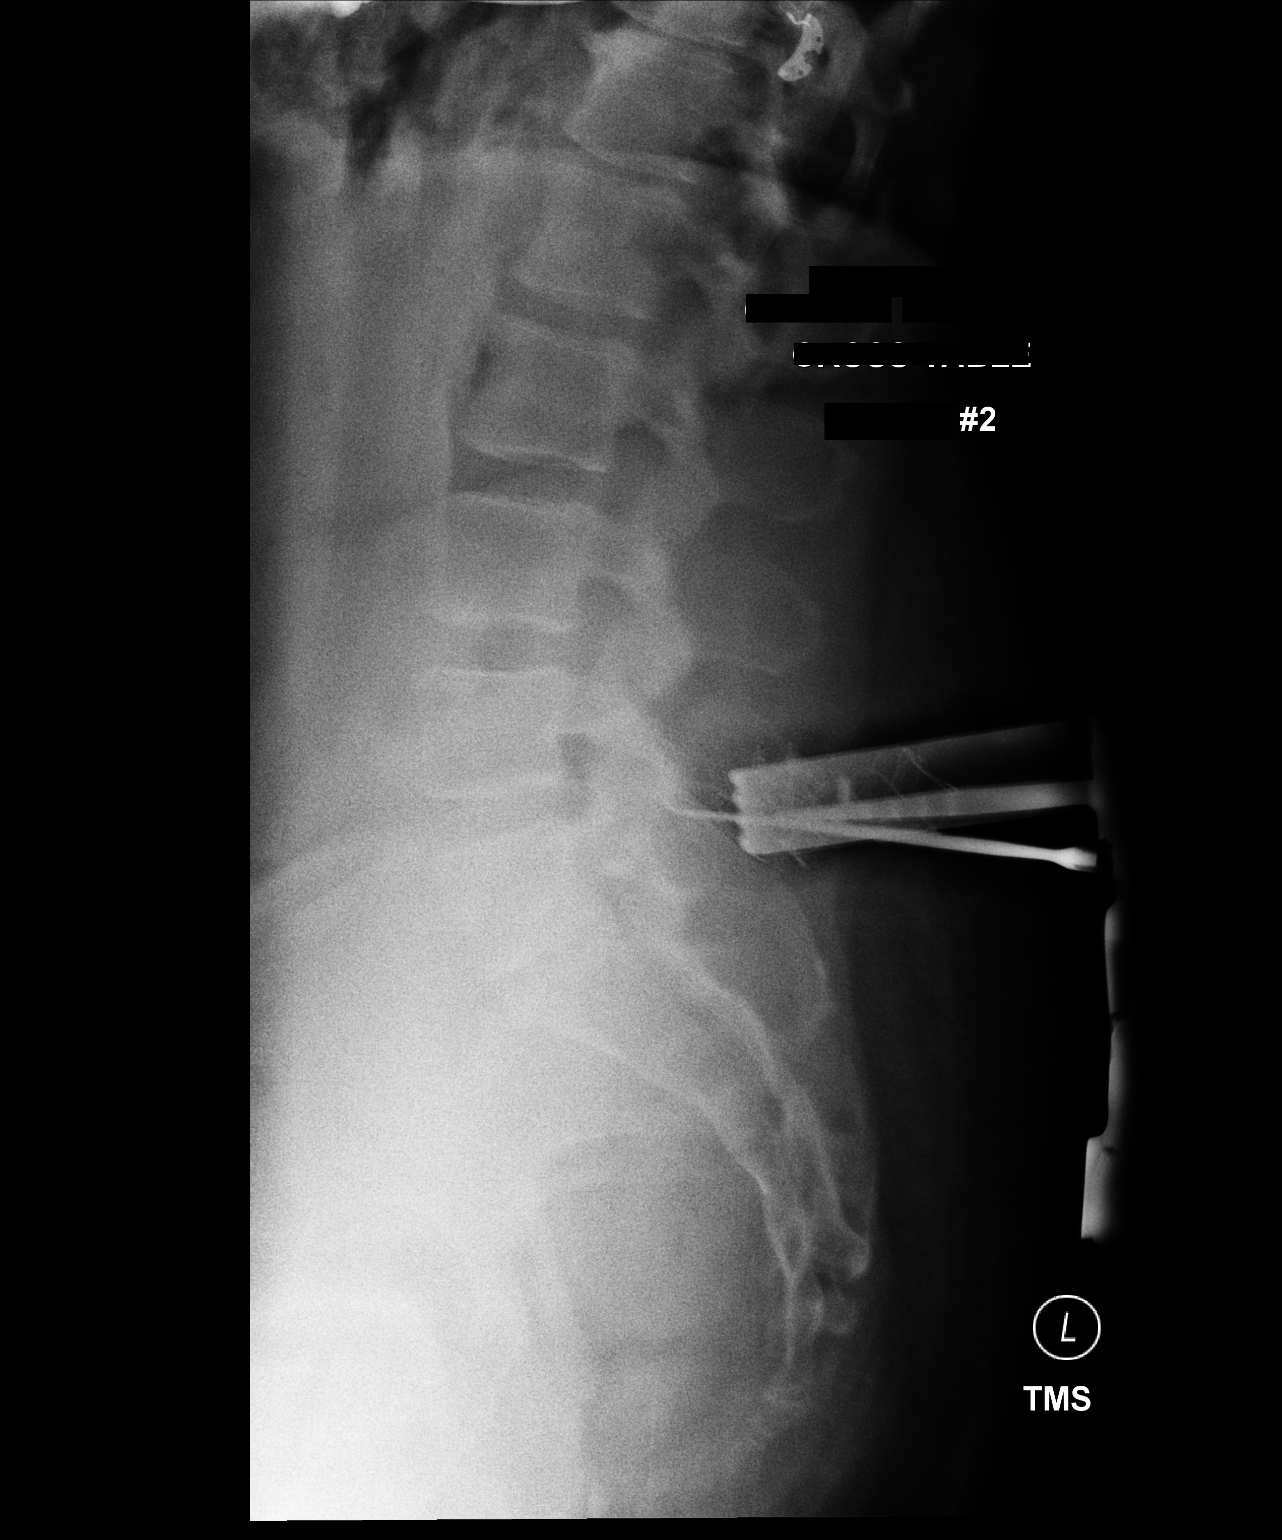

[2 of 2 positions shown; findings below may reference images not displayed]

FINDINGS: Initial image demonstrates a pointed surgical implement over the L4
spinous process with the second image demonstrating a pointed
implement at the L4-5 reticular facet and surgical clamp upon the L4
spinous process. This is in keeping with numbering convention prior
MR imaging. Degenerative changes are similar to comparison prior. No
acute complication is seen. No unexpected radiopaque foreign bodies
are seen.
IMPRESSION: Intraoperative fluoroscopy with localization as above. See operative
report for further details.

## 2020-12-26 SURGERY — LUMBAR LAMINECTOMY/DECOMPRESSION MICRODISCECTOMY 1 LEVEL
Anesthesia: General | Site: Back | Laterality: Left

## 2020-12-26 MED ORDER — ACETAMINOPHEN 650 MG RE SUPP: 650.0000 mg | RECTAL | Status: DC | PRN

## 2020-12-26 MED ORDER — BUPROPION HCL ER (XL) 300 MG PO TB24
300.0000 mg | ORAL_TABLET | Freq: Every day | ORAL | Status: DC
  Administered 2020-12-27: 300 mg via ORAL
  Filled 2020-12-26: qty 1

## 2020-12-26 MED ORDER — INSULIN ASPART 100 UNIT/ML IJ SOLN
0.0000 [IU] | Freq: Three times a day (TID) | INTRAMUSCULAR | Status: DC
  Administered 2020-12-26: 3 [IU] via SUBCUTANEOUS
  Administered 2020-12-27: 5 [IU] via SUBCUTANEOUS

## 2020-12-26 MED ORDER — AMLODIPINE BESYLATE 5 MG PO TABS: 10.0000 mg | ORAL_TABLET | Freq: Every day | ORAL | Status: DC

## 2020-12-26 MED ORDER — METFORMIN HCL 500 MG PO TABS
500.0000 mg | ORAL_TABLET | Freq: Two times a day (BID) | ORAL | Status: DC
  Administered 2020-12-26 – 2020-12-27 (×2): 500 mg via ORAL
  Filled 2020-12-26 (×2): qty 1

## 2020-12-26 MED ORDER — SODIUM CHLORIDE 0.9 % IV SOLN
250.0000 mL | INTRAVENOUS | Status: DC
  Administered 2020-12-26: 250 mL via INTRAVENOUS

## 2020-12-26 MED ORDER — SODIUM CHLORIDE 0.9% FLUSH: 3.0000 mL | INTRAVENOUS | Status: DC | PRN

## 2020-12-26 MED ORDER — HEMOSTATIC AGENTS (NO CHARGE) OPTIME
TOPICAL | Status: DC | PRN
  Administered 2020-12-26: 1 via TOPICAL

## 2020-12-26 MED ORDER — OXYCODONE HCL 5 MG PO TABS
5.0000 mg | ORAL_TABLET | Freq: Once | ORAL | Status: DC | PRN
Start: 2020-12-26 — End: 2020-12-26

## 2020-12-26 MED ORDER — MORPHINE SULFATE (PF) 2 MG/ML IV SOLN: 2.0000 mg | INTRAVENOUS | Status: DC | PRN

## 2020-12-26 MED ORDER — BUPIVACAINE HCL (PF) 0.25 % IJ SOLN
INTRAMUSCULAR | Status: AC
  Filled 2020-12-26: qty 30

## 2020-12-26 MED ORDER — THROMBIN 5000 UNITS EX SOLR
CUTANEOUS | Status: AC
  Filled 2020-12-26: qty 5000

## 2020-12-26 MED ORDER — ROCURONIUM BROMIDE 10 MG/ML (PF) SYRINGE
PREFILLED_SYRINGE | INTRAVENOUS | Status: AC
  Filled 2020-12-26: qty 10

## 2020-12-26 MED ORDER — LIDOCAINE 2% (20 MG/ML) 5 ML SYRINGE
INTRAMUSCULAR | Status: AC
  Filled 2020-12-26: qty 5

## 2020-12-26 MED ORDER — CEFAZOLIN SODIUM-DEXTROSE 2-4 GM/100ML-% IV SOLN
2.0000 g | Freq: Three times a day (TID) | INTRAVENOUS | Status: AC
  Administered 2020-12-26 – 2020-12-27 (×2): 2 g via INTRAVENOUS
  Filled 2020-12-26 (×2): qty 100

## 2020-12-26 MED ORDER — PROPOFOL 10 MG/ML IV BOLUS
INTRAVENOUS | Status: DC | PRN
  Administered 2020-12-26: 150 mg via INTRAVENOUS

## 2020-12-26 MED ORDER — POTASSIUM CHLORIDE IN NACL 20-0.9 MEQ/L-% IV SOLN: INTRAVENOUS | Status: DC

## 2020-12-26 MED ORDER — MIDAZOLAM HCL 2 MG/2ML IJ SOLN
INTRAMUSCULAR | Status: DC | PRN
  Administered 2020-12-26: 2 mg via INTRAVENOUS

## 2020-12-26 MED ORDER — THROMBIN 5000 UNITS EX SOLR
OROMUCOSAL | Status: DC | PRN
  Administered 2020-12-26: 5 mL via TOPICAL

## 2020-12-26 MED ORDER — LIDOCAINE 2% (20 MG/ML) 5 ML SYRINGE
INTRAMUSCULAR | Status: DC | PRN
  Administered 2020-12-26: 60 mg via INTRAVENOUS

## 2020-12-26 MED ORDER — FENTANYL CITRATE (PF) 100 MCG/2ML IJ SOLN
INTRAMUSCULAR | Status: AC
  Filled 2020-12-26: qty 2

## 2020-12-26 MED ORDER — SUGAMMADEX SODIUM 200 MG/2ML IV SOLN
INTRAVENOUS | Status: DC | PRN
  Administered 2020-12-26: 200 mg via INTRAVENOUS

## 2020-12-26 MED ORDER — SITAGLIPTIN PHOS-METFORMIN HCL 50-500 MG PO TABS: 1.0000 | ORAL_TABLET | Freq: Two times a day (BID) | ORAL | Status: DC

## 2020-12-26 MED ORDER — CHLORHEXIDINE GLUCONATE CLOTH 2 % EX PADS: 6.0000 | MEDICATED_PAD | Freq: Once | CUTANEOUS | Status: DC

## 2020-12-26 MED ORDER — PHENOL 1.4 % MT LIQD: 1.0000 | OROMUCOSAL | Status: DC | PRN

## 2020-12-26 MED ORDER — DEXAMETHASONE SODIUM PHOSPHATE 10 MG/ML IJ SOLN
INTRAMUSCULAR | Status: AC
  Filled 2020-12-26: qty 1

## 2020-12-26 MED ORDER — ONDANSETRON HCL 4 MG PO TABS: 4.0000 mg | ORAL_TABLET | Freq: Four times a day (QID) | ORAL | Status: DC | PRN

## 2020-12-26 MED ORDER — SODIUM CHLORIDE 0.9% FLUSH
3.0000 mL | Freq: Two times a day (BID) | INTRAVENOUS | Status: DC
  Administered 2020-12-26: 3 mL via INTRAVENOUS

## 2020-12-26 MED ORDER — FENTANYL CITRATE (PF) 250 MCG/5ML IJ SOLN
INTRAMUSCULAR | Status: AC
  Filled 2020-12-26: qty 5

## 2020-12-26 MED ORDER — ONDANSETRON HCL 4 MG/2ML IJ SOLN
INTRAMUSCULAR | Status: AC
  Filled 2020-12-26: qty 2

## 2020-12-26 MED ORDER — FENTANYL CITRATE (PF) 100 MCG/2ML IJ SOLN
25.0000 ug | INTRAMUSCULAR | Status: DC | PRN
  Administered 2020-12-26 (×2): 50 ug via INTRAVENOUS

## 2020-12-26 MED ORDER — LACTATED RINGERS IV SOLN: INTRAVENOUS | Status: DC

## 2020-12-26 MED ORDER — MIDAZOLAM HCL 2 MG/2ML IJ SOLN
INTRAMUSCULAR | Status: AC
  Filled 2020-12-26: qty 2

## 2020-12-26 MED ORDER — CHLORHEXIDINE GLUCONATE 0.12 % MT SOLN
15.0000 mL | Freq: Once | OROMUCOSAL | Status: AC
  Administered 2020-12-26: 15 mL via OROMUCOSAL
  Filled 2020-12-26: qty 15

## 2020-12-26 MED ORDER — GABAPENTIN 300 MG PO CAPS
300.0000 mg | ORAL_CAPSULE | ORAL | Status: AC
  Administered 2020-12-26: 300 mg via ORAL
  Filled 2020-12-26: qty 1

## 2020-12-26 MED ORDER — HYDROCODONE-ACETAMINOPHEN 7.5-325 MG PO TABS
1.0000 | ORAL_TABLET | Freq: Four times a day (QID) | ORAL | Status: DC
Start: 2020-12-26 — End: 2020-12-27
  Administered 2020-12-26 – 2020-12-27 (×4): 1 via ORAL
  Filled 2020-12-26 (×4): qty 1

## 2020-12-26 MED ORDER — PANTOPRAZOLE SODIUM 40 MG PO TBEC
40.0000 mg | DELAYED_RELEASE_TABLET | Freq: Every day | ORAL | Status: DC
  Administered 2020-12-27: 40 mg via ORAL
  Filled 2020-12-26: qty 1

## 2020-12-26 MED ORDER — MENTHOL 3 MG MT LOZG: 1.0000 | LOZENGE | OROMUCOSAL | Status: DC | PRN

## 2020-12-26 MED ORDER — LINAGLIPTIN 5 MG PO TABS
5.0000 mg | ORAL_TABLET | Freq: Every day | ORAL | Status: DC
  Administered 2020-12-27: 5 mg via ORAL
  Filled 2020-12-26: qty 1

## 2020-12-26 MED ORDER — CEFAZOLIN IN SODIUM CHLORIDE 3-0.9 GM/100ML-% IV SOLN
3.0000 g | INTRAVENOUS | Status: AC
  Administered 2020-12-26: 3 g via INTRAVENOUS
  Filled 2020-12-26: qty 100

## 2020-12-26 MED ORDER — PROPOFOL 10 MG/ML IV BOLUS
INTRAVENOUS | Status: AC
  Filled 2020-12-26: qty 20

## 2020-12-26 MED ORDER — DEXAMETHASONE SODIUM PHOSPHATE 10 MG/ML IJ SOLN
10.0000 mg | Freq: Once | INTRAMUSCULAR | Status: AC
  Administered 2020-12-26: 10 mg via INTRAVENOUS

## 2020-12-26 MED ORDER — SENNA 8.6 MG PO TABS
1.0000 | ORAL_TABLET | Freq: Two times a day (BID) | ORAL | Status: DC
  Administered 2020-12-26 – 2020-12-27 (×2): 8.6 mg via ORAL
  Filled 2020-12-26 (×2): qty 1

## 2020-12-26 MED ORDER — ACETAMINOPHEN 325 MG PO TABS: 650.0000 mg | ORAL_TABLET | ORAL | Status: DC | PRN

## 2020-12-26 MED ORDER — THROMBIN 5000 UNITS EX SOLR
CUTANEOUS | Status: DC | PRN
  Administered 2020-12-26 (×2): 5000 [IU] via TOPICAL

## 2020-12-26 MED ORDER — METHOCARBAMOL 500 MG PO TABS
500.0000 mg | ORAL_TABLET | Freq: Four times a day (QID) | ORAL | Status: DC | PRN
  Administered 2020-12-26 – 2020-12-27 (×4): 500 mg via ORAL
  Filled 2020-12-26 (×4): qty 1

## 2020-12-26 MED ORDER — OXYCODONE HCL 5 MG/5ML PO SOLN: 5.0000 mg | Freq: Once | ORAL | Status: DC | PRN

## 2020-12-26 MED ORDER — PROMETHAZINE HCL 25 MG/ML IJ SOLN
6.2500 mg | INTRAMUSCULAR | Status: DC | PRN
Start: 2020-12-26 — End: 2020-12-26

## 2020-12-26 MED ORDER — THROMBIN 5000 UNITS EX SOLR
CUTANEOUS | Status: AC
  Filled 2020-12-26: qty 10000

## 2020-12-26 MED ORDER — METHOCARBAMOL 1000 MG/10ML IJ SOLN
500.0000 mg | Freq: Four times a day (QID) | INTRAVENOUS | Status: DC | PRN
  Filled 2020-12-26: qty 5

## 2020-12-26 MED ORDER — FENTANYL CITRATE (PF) 250 MCG/5ML IJ SOLN
INTRAMUSCULAR | Status: DC | PRN
  Administered 2020-12-26: 50 ug via INTRAVENOUS
  Administered 2020-12-26: 100 ug via INTRAVENOUS

## 2020-12-26 MED ORDER — BUPIVACAINE HCL (PF) 0.25 % IJ SOLN
INTRAMUSCULAR | Status: DC | PRN
  Administered 2020-12-26: 5 mL

## 2020-12-26 MED ORDER — ORAL CARE MOUTH RINSE: 15.0000 mL | Freq: Once | OROMUCOSAL | Status: AC

## 2020-12-26 MED ORDER — 0.9 % SODIUM CHLORIDE (POUR BTL) OPTIME
TOPICAL | Status: DC | PRN
  Administered 2020-12-26: 1000 mL

## 2020-12-26 MED ORDER — ROCURONIUM BROMIDE 10 MG/ML (PF) SYRINGE
PREFILLED_SYRINGE | INTRAVENOUS | Status: DC | PRN
  Administered 2020-12-26: 70 mg via INTRAVENOUS

## 2020-12-26 MED ORDER — ONDANSETRON HCL 4 MG/2ML IJ SOLN
INTRAMUSCULAR | Status: DC | PRN
  Administered 2020-12-26: 4 mg via INTRAVENOUS

## 2020-12-26 MED ORDER — ACETAMINOPHEN 500 MG PO TABS
1000.0000 mg | ORAL_TABLET | ORAL | Status: AC
  Administered 2020-12-26: 1000 mg via ORAL
  Filled 2020-12-26: qty 2

## 2020-12-26 MED ORDER — CELECOXIB 200 MG PO CAPS
200.0000 mg | ORAL_CAPSULE | Freq: Two times a day (BID) | ORAL | Status: DC
  Administered 2020-12-26 – 2020-12-27 (×3): 200 mg via ORAL
  Filled 2020-12-26 (×3): qty 1

## 2020-12-26 MED ORDER — ONDANSETRON HCL 4 MG/2ML IJ SOLN: 4.0000 mg | Freq: Four times a day (QID) | INTRAMUSCULAR | Status: DC | PRN

## 2020-12-26 MED ORDER — SCOPOLAMINE 1 MG/3DAYS TD PT72
1.0000 | MEDICATED_PATCH | TRANSDERMAL | Status: DC
  Administered 2020-12-26: 1.5 mg via TRANSDERMAL
  Filled 2020-12-26: qty 1

## 2020-12-26 MED ORDER — HYDROCHLOROTHIAZIDE 25 MG PO TABS: 25.0000 mg | ORAL_TABLET | Freq: Every day | ORAL | Status: DC

## 2020-12-26 SURGICAL SUPPLY — 44 items
APL SKNCLS STERI-STRIP NONHPOA (GAUZE/BANDAGES/DRESSINGS) ×1
BAND INSRT 18 STRL LF DISP RB (MISCELLANEOUS) ×2
BAND RUBBER #18 3X1/16 STRL (MISCELLANEOUS) ×6 IMPLANT
BENZOIN TINCTURE PRP APPL 2/3 (GAUZE/BANDAGES/DRESSINGS) ×3 IMPLANT
BUR CARBIDE MATCH 3.0 (BURR) ×3 IMPLANT
CANISTER SUCT 3000ML PPV (MISCELLANEOUS) ×3 IMPLANT
CLOSURE WOUND 1/2 X4 (GAUZE/BANDAGES/DRESSINGS) ×1
COVER WAND RF STERILE (DRAPES) ×3 IMPLANT
DRAPE LAPAROTOMY 100X72X124 (DRAPES) ×3 IMPLANT
DRAPE MICROSCOPE LEICA (MISCELLANEOUS) ×3 IMPLANT
DRAPE SURG 17X23 STRL (DRAPES) ×3 IMPLANT
DRSG OPSITE POSTOP 4X6 (GAUZE/BANDAGES/DRESSINGS) ×3 IMPLANT
DURAPREP 26ML APPLICATOR (WOUND CARE) ×3 IMPLANT
ELECT BLADE 4.0 EZ CLEAN MEGAD (MISCELLANEOUS) ×3
ELECT REM PT RETURN 9FT ADLT (ELECTROSURGICAL) ×3
ELECTRODE BLDE 4.0 EZ CLN MEGD (MISCELLANEOUS) ×1 IMPLANT
ELECTRODE REM PT RTRN 9FT ADLT (ELECTROSURGICAL) ×1 IMPLANT
GAUZE 4X4 16PLY RFD (DISPOSABLE) IMPLANT
GLOVE SURG ENC MOIS LTX SZ7 (GLOVE) IMPLANT
GLOVE SURG ENC MOIS LTX SZ8 (GLOVE) ×3 IMPLANT
GLOVE SURG UNDER POLY LF SZ7 (GLOVE) IMPLANT
GOWN STRL REUS W/ TWL LRG LVL3 (GOWN DISPOSABLE) IMPLANT
GOWN STRL REUS W/ TWL XL LVL3 (GOWN DISPOSABLE) ×1 IMPLANT
GOWN STRL REUS W/TWL 2XL LVL3 (GOWN DISPOSABLE) IMPLANT
GOWN STRL REUS W/TWL LRG LVL3 (GOWN DISPOSABLE)
GOWN STRL REUS W/TWL XL LVL3 (GOWN DISPOSABLE) ×3
KIT BASIN OR (CUSTOM PROCEDURE TRAY) ×3 IMPLANT
KIT TURNOVER KIT B (KITS) ×3 IMPLANT
NDL HYPO 25X1 1.5 SAFETY (NEEDLE) ×1 IMPLANT
NDL SPNL 20GX3.5 QUINCKE YW (NEEDLE) IMPLANT
NEEDLE HYPO 25X1 1.5 SAFETY (NEEDLE) ×3 IMPLANT
NEEDLE SPNL 20GX3.5 QUINCKE YW (NEEDLE) IMPLANT
NS IRRIG 1000ML POUR BTL (IV SOLUTION) ×3 IMPLANT
PACK LAMINECTOMY NEURO (CUSTOM PROCEDURE TRAY) ×3 IMPLANT
PAD ARMBOARD 7.5X6 YLW CONV (MISCELLANEOUS) ×9 IMPLANT
SPONGE SURGIFOAM ABS GEL SZ50 (HEMOSTASIS) IMPLANT
STRIP CLOSURE SKIN 1/2X4 (GAUZE/BANDAGES/DRESSINGS) ×2 IMPLANT
SUT VIC AB 0 CT1 18XCR BRD8 (SUTURE) ×1 IMPLANT
SUT VIC AB 0 CT1 8-18 (SUTURE) ×3
SUT VIC AB 2-0 CP2 18 (SUTURE) ×3 IMPLANT
SUT VIC AB 3-0 SH 8-18 (SUTURE) ×3 IMPLANT
TOWEL GREEN STERILE (TOWEL DISPOSABLE) ×3 IMPLANT
TOWEL GREEN STERILE FF (TOWEL DISPOSABLE) ×3 IMPLANT
WATER STERILE IRR 1000ML POUR (IV SOLUTION) ×3 IMPLANT

## 2020-12-26 NOTE — Anesthesia Preprocedure Evaluation (Addendum)
Anesthesia Evaluation  Patient identified by MRN, date of birth, ID band Patient awake    Reviewed: Allergy & Precautions, NPO status , Patient's Chart, lab work & pertinent test results  Airway Mallampati: I  TM Distance: >3 FB Neck ROM: Full    Dental no notable dental hx.    Pulmonary neg pulmonary ROS,    Pulmonary exam normal breath sounds clear to auscultation       Cardiovascular Exercise Tolerance: Good hypertension, negative cardio ROS Normal cardiovascular exam Rhythm:Regular Rate:Normal     Neuro/Psych PSYCHIATRIC DISORDERS Anxiety Depression negative neurological ROS     GI/Hepatic hiatal hernia, GERD  ,  Endo/Other  diabetes, Type obesity  Renal/GU   negative genitourinary   Musculoskeletal negative musculoskeletal ROS (+)   Abdominal (+) + obese,   Peds negative pediatric ROS (+)  Hematology negative hematology ROS (+)   Anesthesia Other Findings   Reproductive/Obstetrics negative OB ROS                            Anesthesia Physical Anesthesia Plan  ASA: 3  Anesthesia Plan: General   Post-op Pain Management:    Induction: Intravenous  PONV Risk Score and Plan: 3 and Midazolam, Treatment may vary due to age or medical condition and Ondansetron  Airway Management Planned: Oral ETT  Additional Equipment: None  Intra-op Plan:   Post-operative Plan: Extubation in OR  Informed Consent: I have reviewed the patients History and Physical, chart, labs and discussed the procedure including the risks, benefits and alternatives for the proposed anesthesia with the patient or authorized representative who has indicated his/her understanding and acceptance.       Plan Discussed with: CRNA and Anesthesiologist  Anesthesia Plan Comments:         Anesthesia Quick Evaluation

## 2020-12-26 NOTE — Transfer of Care (Signed)
Immediate Anesthesia Transfer of Care Note  Patient: Olivia Campbell  Procedure(s) Performed: Laminectomy and Foraminotomy with diskectomy L4-5 left (Left: Back)  Patient Location: PACU  Anesthesia Type:General  Level of Consciousness: awake, alert  and oriented  Airway & Oxygen Therapy: Patient Spontanous Breathing and Patient connected to face mask oxygen  Post-op Assessment: Report given to RN and Post -op Vital signs reviewed and stable  Post vital signs: Reviewed and stable  Last Vitals:  Vitals Value Taken Time  BP 151/77 12/26/20 1359  Temp    Pulse 81 12/26/20 1401  Resp 17 12/26/20 1401  SpO2 100 % 12/26/20 1401  Vitals shown include unvalidated device data.  Last Pain:  Vitals:   12/26/20 0953  TempSrc:   PainSc: 5       Patients Stated Pain Goal: 0 (12/26/20 0953)  Complications: No notable events documented.

## 2020-12-26 NOTE — Op Note (Signed)
12/26/2020  1:49 PM  PATIENT:  Olivia Campbell  55 y.o. 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 Maisie Fus  ANESTHESIA:   General  EBL: 40 ml  Total I/O In: -  Out: 40 [Blood:40]  BLOOD ADMINISTERED: none  DRAINS: None  SPECIMEN:  none  INDICATION FOR PROCEDURE: This patient presented with severe left leg pain that seem to follow an L5 distribution. Imaging showed sniffing and left lateral recess stenosis with a left-sided disc herniation L4-5. The patient tried conservative measures without relief. Pain was debilitating. Recommended left L4-5 decompression and discectomy. Patient understood the risks, benefits, and alternatives and potential outcomes and wished to proceed.  PROCEDURE DETAILS: The patient was taken to the operating room and after induction of adequate generalized endotracheal anesthesia, the patient was rolled into the prone position on the Wilson frame and all pressure points were padded. The lumbar region was cleaned and then prepped with DuraPrep and draped in the usual sterile fashion. 5 cc of local anesthesia was injected and then a dorsal midline incision was made and carried down to the lumbo sacral fascia. The fascia was opened and the paraspinous musculature was taken down in a subperiosteal fashion to expose L4-5 on the left. Intraoperative x-ray confirmed my level, and then I used a combination of the high-speed drill and the Kerrison punches to perform a hemilaminectomy, medial facetectomy, and foraminotomy at L4-5 on the left. The underlying yellow ligament was opened and removed in a piecemeal fashion to expose the underlying  dura and exiting nerve root. I undercut the lateral recess and dissected down until I was medial to and distal to the pedicle.  We did a fairly wide hemilaminectomy and medial facetectomy in order to decompress both the L4 and the L5 nerve roots.  There was a large disc herniation with a bulging annulus but there was a superior free fragment.  This was removed first.  We worked up under the L4 nerve root with a coronary dilator and swept any disc there caudally.  The nerve root was well decompressed. We then gently retracted the nerve root medially with a retractor, coagulated the epidural venous vasculature, and incised the disc space. We performed a thorough intradiscal discectomy with pituitary rongeurs working both medially and laterally and the disc space, until I had a nice decompression of the nerve root and the midline. I then palpated with a coronary dilator along the nerve root and into the foramen to assure adequate decompression. I felt no more compression of the nerve root. I irrigated with saline solution containing bacitracin. Achieved hemostasis with bipolar cautery, lined the dura with Gelfoam, and then closed the fascia with 0 Vicryl. I closed the subcutaneous tissues with 2-0 Vicryl and the subcuticular tissues with 3-0 Vicryl. The skin was then closed with benzoin and Steri-Strips. The drapes were removed, a sterile dressing was applied.  My nurse practitioner and Dr. Maisie Fus was involved in the exposure, safe retraction of the neural elements, the disc work and the closure. the patient was awakened from general anesthesia and transferred to the recovery room in stable condition. At the end of the procedure all sponge, needle and instrument counts were correct.    Campbell OF CARE: Admit for overnight observation  PATIENT DISPOSITION:  PACU - hemodynamically stable.   Delay start of Pharmacological VTE agent (>24hrs) due to surgical blood loss or risk of bleeding:   yes

## 2020-12-26 NOTE — Anesthesia Postprocedure Evaluation (Signed)
Anesthesia Post Note  Patient: Olivia Campbell  Procedure(s) Performed: Laminectomy and Foraminotomy with diskectomy L4-5 left (Left: Back)     Patient location during evaluation: PACU Anesthesia Type: General Level of consciousness: awake and alert Pain management: pain level controlled Vital Signs Assessment: post-procedure vital signs reviewed and stable Respiratory status: spontaneous breathing, nonlabored ventilation, respiratory function stable and patient connected to nasal cannula oxygen Cardiovascular status: blood pressure returned to baseline and stable Postop Assessment: no apparent nausea or vomiting Anesthetic complications: no   No notable events documented.  Last Vitals:  Vitals:   12/26/20 1503 12/26/20 1504  BP: (!) 121/59 (!) 121/59  Pulse: 75 76  Resp: 20 20  Temp:    SpO2:  99%    Last Pain:  Vitals:   12/26/20 1445  TempSrc:   PainSc: 4                  Candra R Tanyon Alipio

## 2020-12-26 NOTE — Anesthesia Procedure Notes (Signed)
Procedure Name: Intubation Date/Time: 12/26/2020 12:07 PM Performed by: Dorthea Cove, CRNA Pre-anesthesia Checklist: Patient identified, Emergency Drugs available, Suction available and Patient being monitored Patient Re-evaluated:Patient Re-evaluated prior to induction Oxygen Delivery Method: Circle system utilized Preoxygenation: Pre-oxygenation with 100% oxygen Induction Type: IV induction Ventilation: Mask ventilation without difficulty and Two handed mask ventilation required Laryngoscope Size: Mac and 3 Grade View: Grade I Tube type: Oral Tube size: 7.0 mm Number of attempts: 1 Airway Equipment and Method: Stylet and Oral airway Placement Confirmation: ETT inserted through vocal cords under direct vision, positive ETCO2 and breath sounds checked- equal and bilateral Secured at: 21 cm Tube secured with: Tape Dental Injury: Teeth and Oropharynx as per pre-operative assessment

## 2020-12-26 NOTE — H&P (Signed)
Subjective: Patient is a 55 y.o. female admitted for L leg pain. Onset of symptoms was several months ago, gradually worsening since that time.  The pain is rated severe, and is located at the across the lower back and radiates to LLE. The pain is described as aching and occurs all day. The symptoms have been progressive. Symptoms are exacerbated by exercise and standing. MRI or CT showed HNP with stenosis L4-5 L  Past Medical History:  Diagnosis Date   Anxiety    Depression    Diabetes mellitus without complication (HCC)    type 2   Endometriosis    GERD (gastroesophageal reflux disease)    History of hiatal hernia    Hypertension     Past Surgical History:  Procedure Laterality Date   ABLATION  07/2012   ESSURE TUBAL LIGATION Bilateral    EYE SURGERY Left    mole in left eye removed as a child    Prior to Admission medications   Medication Sig Start Date End Date Taking? Authorizing Provider  amLODipine (NORVASC) 10 MG tablet Take 10 mg by mouth daily. 09/30/20  Yes [provider]  buPROPion (WELLBUTRIN XL) 300 MG 24 hr tablet Take 300 mg by mouth daily. 09/22/20  Yes [provider]  diclofenac Sodium (VOLTAREN) 1 % GEL Apply 1 application topically daily as needed for pain.   Yes [provider]  hydrochlorothiazide (HYDRODIURIL) 25 MG tablet Take 25 mg by mouth daily. 09/30/20  Yes [provider]  pantoprazole (PROTONIX) 40 MG tablet Take 40 mg by mouth daily. 10/27/20  Yes [provider]  sitaGLIPtin-metformin (JANUMET) 50-500 MG tablet Take 1 tablet by mouth 2 (two) times daily with a meal.   Yes [provider]  FLUoxetine (PROZAC) 20 MG tablet Take 20 mg by mouth daily. Patient not taking: No sig reported    [provider]  predniSONE (DELTASONE) 20 MG tablet Take 2 tablets (40 mg total) by mouth daily. Patient not taking: No sig reported 11/27/16   Gwyneth Sprout, MD   Allergies  Allergen Reactions    Lisinopril Anaphylaxis    angioedema   Ciprofloxacin Rash   Novocain [Procaine]     Burning sensation   Amoxicillin-Pot Clavulanate Rash    Social History   Tobacco Use   Smoking status: Never   Smokeless tobacco: Never  Substance Use Topics   Alcohol use: Never    Family History  Problem Relation Age of Onset   Breast cancer Neg Hx      Review of Systems  Positive ROS: neg  All other systems have been reviewed and were otherwise negative with the exception of those mentioned in the HPI and as above.  Objective: Vital signs in last 24 hours: Temp:  [98.4 F (36.9 C)] 98.4 F (36.9 C) (06/24 0934) Pulse Rate:  [92] 92 (06/24 0934) Resp:  [18] 18 (06/24 0934) BP: (155)/(93) 155/93 (06/24 0934) SpO2:  [98 %] 98 % (06/24 0934) Weight:  [518 kg] 122 kg (06/24 0934)  General Appearance: Alert, cooperative, no distress, appears stated age Head: Normocephalic, without obvious abnormality, atraumatic Eyes: PERRL, conjunctiva/corneas clear, EOM's intact    Neck: Supple, symmetrical, trachea midline Back: Symmetric, no curvature, ROM normal, no CVA tenderness Lungs:  respirations unlabored Heart: Regular rate and rhythm Abdomen: Soft, non-tender Extremities: Extremities normal, atraumatic, no cyanosis or edema Pulses: 2+ and symmetric all extremities Skin: Skin color, texture, turgor normal, no rashes or lesions  NEUROLOGIC:   Mental status:  Alert and oriented x4,  no aphasia, good attention span, fund of knowledge, and memory Motor Exam - grossly normal Sensory Exam - grossly normal Reflexes: 1= Coordination - grossly normal Gait - grossly normal Balance - grossly normal Cranial Nerves: I: smell Not tested  II: visual acuity  OS: nl    OD: nl  II: visual fields Full to confrontation  II: pupils Equal, round, reactive to light  III,VII: ptosis None  III,IV,VI: extraocular muscles  Full ROM  V: mastication Normal  V: facial light touch sensation  Normal  V,VII:  corneal reflex  Present  VII: facial muscle function - upper  Normal  VII: facial muscle function - lower Normal  VIII: hearing Not tested  IX: soft palate elevation  Normal  IX,X: gag reflex Present  XI: trapezius strength  5/5  XI: sternocleidomastoid strength 5/5  XI: neck flexion strength  5/5  XII: tongue strength  Normal    Data Review Lab Results  Component Value Date   WBC 8.0 12/24/2020   HGB 12.1 12/24/2020   HCT 38.4 12/24/2020   MCV 78.2 (L) 12/24/2020   PLT 390 12/24/2020   Lab Results  Component Value Date   NA 138 12/24/2020   K 3.2 (L) 12/24/2020   CL 103 12/24/2020   CO2 27 12/24/2020   BUN 10 12/24/2020   CREATININE 0.99 12/24/2020   GLUCOSE 152 (H) 12/24/2020   Lab Results  Component Value Date   INR 1.0 12/24/2020    Assessment/Plan:  Estimated body mass index is 47.65 kg/m as calculated from the following:   Height as of this encounter: 5\' 3"  (1.6 m).   Weight as of this encounter: 122 kg. Patient admitted for L L4-5 decompression and diskectomy. Patient has failed a reasonable attempt at conservative therapy.  I explained the condition and procedure to the patient and answered any questions.  Patient wishes to proceed with procedure as planned. Understands risks/ benefits and typical outcomes of procedure.   12/26/2020 10:57 AM

## 2020-12-27 ENCOUNTER — Encounter (HOSPITAL_COMMUNITY): Payer: Self-pay | Admitting: Neurological Surgery

## 2020-12-27 DIAGNOSIS — M5116 Intervertebral disc disorders with radiculopathy, lumbar region: Secondary | ICD-10-CM | POA: Diagnosis not present

## 2020-12-27 LAB — GLUCOSE, CAPILLARY: Glucose-Capillary: 215 mg/dL — ABNORMAL HIGH (ref 70–99)

## 2020-12-27 MED ORDER — METHOCARBAMOL 500 MG PO TABS
500.0000 mg | ORAL_TABLET | Freq: Four times a day (QID) | ORAL | 1 refills | Status: AC | PRN
Start: 2020-12-27 — End: ?

## 2020-12-27 MED ORDER — HYDROCODONE-ACETAMINOPHEN 7.5-325 MG PO TABS: 1.0000 | ORAL_TABLET | Freq: Four times a day (QID) | ORAL | 0 refills | Status: AC

## 2020-12-27 NOTE — Progress Notes (Signed)
Patient alert and oriented, voiding adequately, skin clean, dry and intact without evidence of skin break down, or symptoms of complications - no redness or edema noted, only slight tenderness at site.  Patient states pain is manageable at time of discharge. Patient has an appointment with MD in 2 weeks 

## 2020-12-27 NOTE — Discharge Summary (Signed)
  Physician Discharge Summary  Patient ID: Olivia Campbell MRN: [id number] DOB/AGE: May 13, 1966 55 y.o.  Admit date: 12/26/2020 Discharge date: 12/27/2020  Admission Diagnoses:  L4-5 left herniated disc with left lower extremity radiculopathy and stenosis  Discharge Diagnoses:  Same Active Problems:   S/P lumbar laminectomy   Discharged Condition: Stable  Hospital Course:  TEMARA LANUM is a 55 y.o. female who presented for an elective left L4-5 foraminotomies decompression and microdiscectomy.  She tolerated the surgery well.  Postoperatively her radicular symptoms were improved.  She was ambulating well, tolerating a normal diet and her pain was controlled on oral medication.  She was at her neurologic baseline.  Her wound was clean dry and intact.  Treatments: Surgery -left L4-5 microdiscectomy, medial facetectomy, foraminotomies, 12/26/2020  Discharge Exam: Blood pressure (!) 128/57, pulse 67, temperature 98.3 F (36.8 C), temperature source Oral, resp. rate 18, height 5\' 3"  (1.6 m), weight 122 kg, last menstrual period 12/16/2012, SpO2 99 %. Awake, alert, orientedx3 PERRL EOMI Speech fluent, appropriate CN grossly intact 5/5 BUE/BLE Wound c/d/i  Disposition: Discharge disposition: 01-Home or Self Care        Allergies as of 12/27/2020       Reactions   Lisinopril Anaphylaxis   angioedema   Ciprofloxacin Rash   Novocain [procaine]    Burning sensation   Amoxicillin-pot Clavulanate Rash        Medication List     STOP taking these medications    diclofenac Sodium 1 % Gel Commonly known as: VOLTAREN   FLUoxetine 20 MG tablet Commonly known as: PROZAC   predniSONE 20 MG tablet Commonly known as: DELTASONE       TAKE these medications    amLODipine 10 MG tablet Commonly known as: NORVASC Take 10 mg by mouth daily.   buPROPion 300 MG 24 hr tablet Commonly known as: WELLBUTRIN XL Take 300 mg by mouth daily.   hydrochlorothiazide 25 MG  tablet Commonly known as: HYDRODIURIL Take 25 mg by mouth daily.   HYDROcodone-acetaminophen 7.5-325 MG tablet Commonly known as: NORCO Take 1 tablet by mouth every 6 (six) hours.   methocarbamol 500 MG tablet Commonly known as: ROBAXIN Take 1 tablet (500 mg total) by mouth every 6 (six) hours as needed for muscle spasms.   pantoprazole 40 MG tablet Commonly known as: PROTONIX Take 40 mg by mouth daily.   sitaGLIPtin-metformin 50-500 MG tablet Commonly known as: JANUMET Take 1 tablet by mouth 2 (two) times daily with a meal.        Follow-up Information     11-02-1976, MD Follow up in 2 week(s).   Specialty: Neurosurgery Contact information: [address] [place] [phone]                 Signed: [phone] Rawan Riendeau 12/27/2020, 9:19 AM

## 2020-12-27 NOTE — Discharge Instructions (Signed)
Wound Care °Keep incision covered and dry for two days. °Do not put any creams, lotions, or ointments on incision. °Leave steri-strips on back.  They will fall off by themselves. °Activity °Walk each and every day, increasing distance each day. °No lifting greater than 5 lbs.  Avoid excessive neck motion. °No driving for 2 weeks; may ride as a passenger locally. ° °Diet °Resume your normal diet.  °Return to Work °Will be discussed at you follow up appointment. °Call Your Doctor If Any of These Occur °Redness, drainage, or swelling at the wound.  °Temperature greater than 101 degrees. °Severe pain not relieved by pain medication. °Incision starts to come apart. °Follow Up Appt °Call today for appointment in 1-2 weeks ([phone]) or for problems.  ° ° °

## 2020-12-27 NOTE — Plan of Care (Signed)
Adequately Ready for discharge °

## 2021-01-29 ENCOUNTER — Other Ambulatory Visit: Payer: Self-pay | Admitting: Physician Assistant

## 2021-01-29 DIAGNOSIS — Z1231 Encounter for screening mammogram for malignant neoplasm of breast: Secondary | ICD-10-CM

## 2021-03-23 ENCOUNTER — Other Ambulatory Visit: Payer: Self-pay

## 2021-03-23 ENCOUNTER — Ambulatory Visit
Admission: RE | Admit: 2021-03-23 | Discharge: 2021-03-23 | Disposition: A | Discharge status: Home / Self Care | Payer: Medicaid Other | Source: Ambulatory Visit | Attending: Physician Assistant | Admitting: Physician Assistant

## 2021-03-23 DIAGNOSIS — Z1231 Encounter for screening mammogram for malignant neoplasm of breast: Secondary | ICD-10-CM

## 2021-03-23 IMAGING — MG MM DIGITAL SCREENING BILAT W/ TOMO AND CAD
6 of 12 series · 6 of 36 positions shown · non-contrast
Comparison: Previous exam(s).

CLINICAL DATA: Screening.

EXAM:
DIGITAL SCREENING BILATERAL MAMMOGRAM WITH TOMOSYNTHESIS AND CAD
TECHNIQUE: Bilateral screening digital craniocaudal and mediolateral oblique
mammograms were obtained. Bilateral screening digital breast
tomosynthesis was performed. The images were evaluated with
computer-aided detection.

[R CV synth-2D]
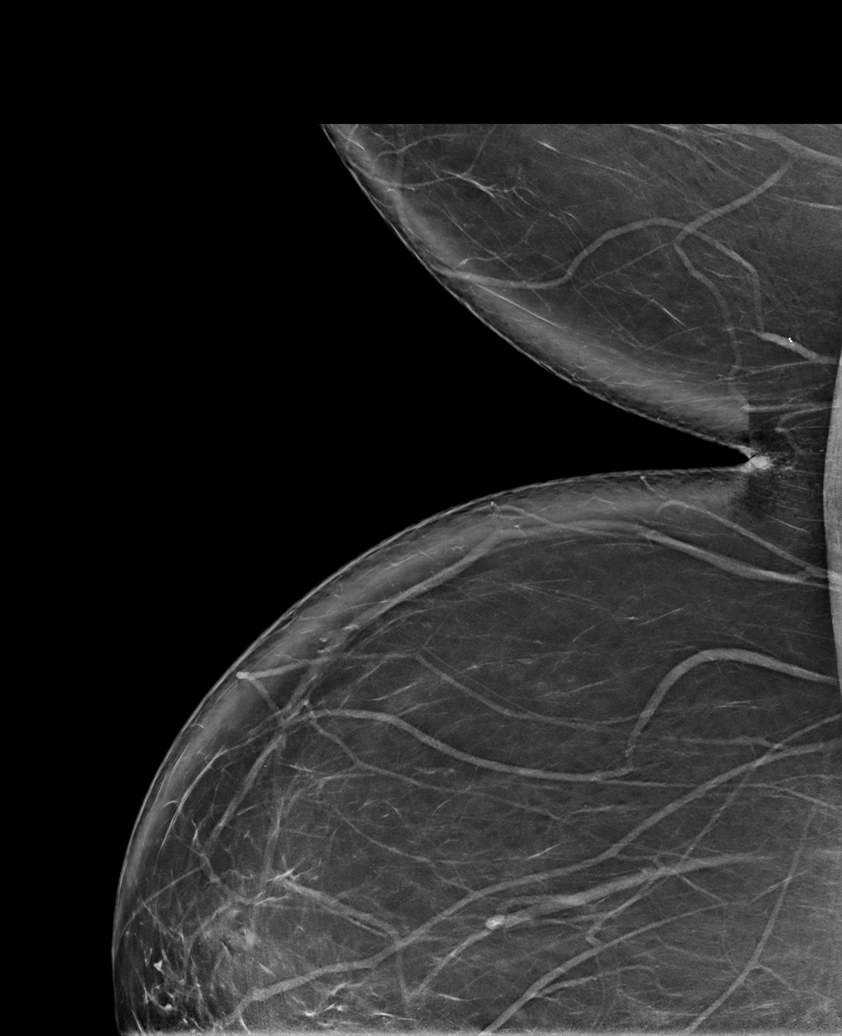

[R CC synth-2D]
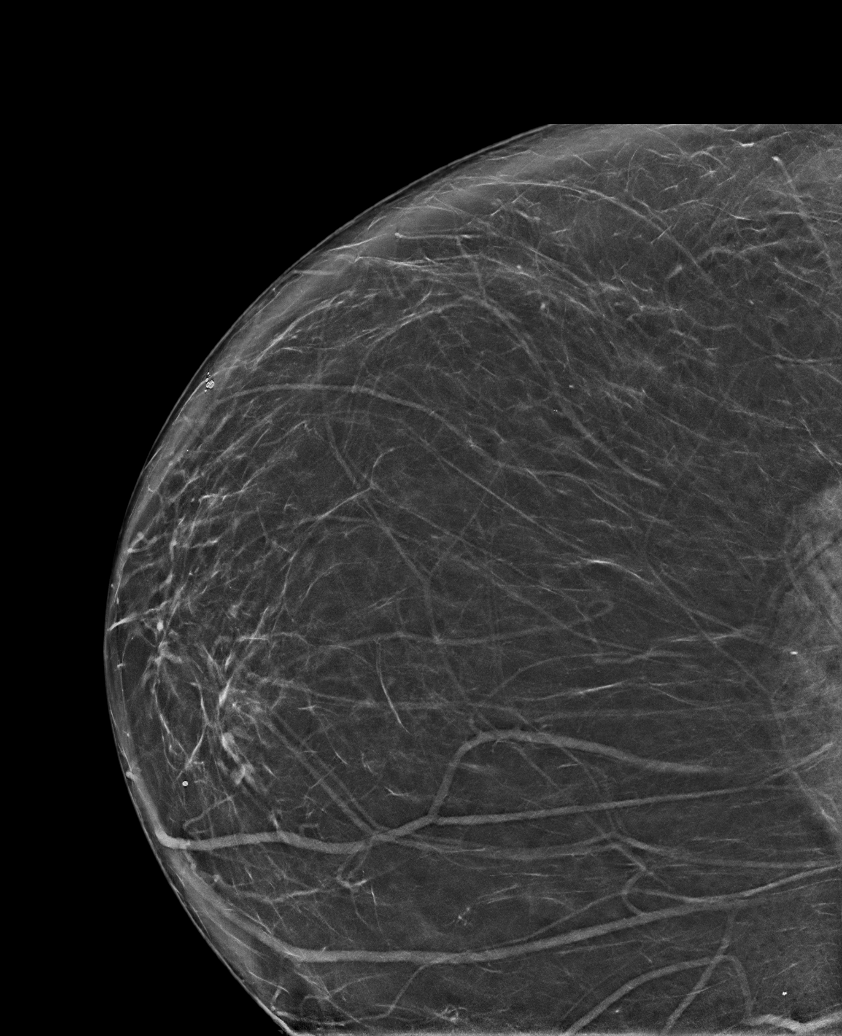

[R MLO synth-2D]
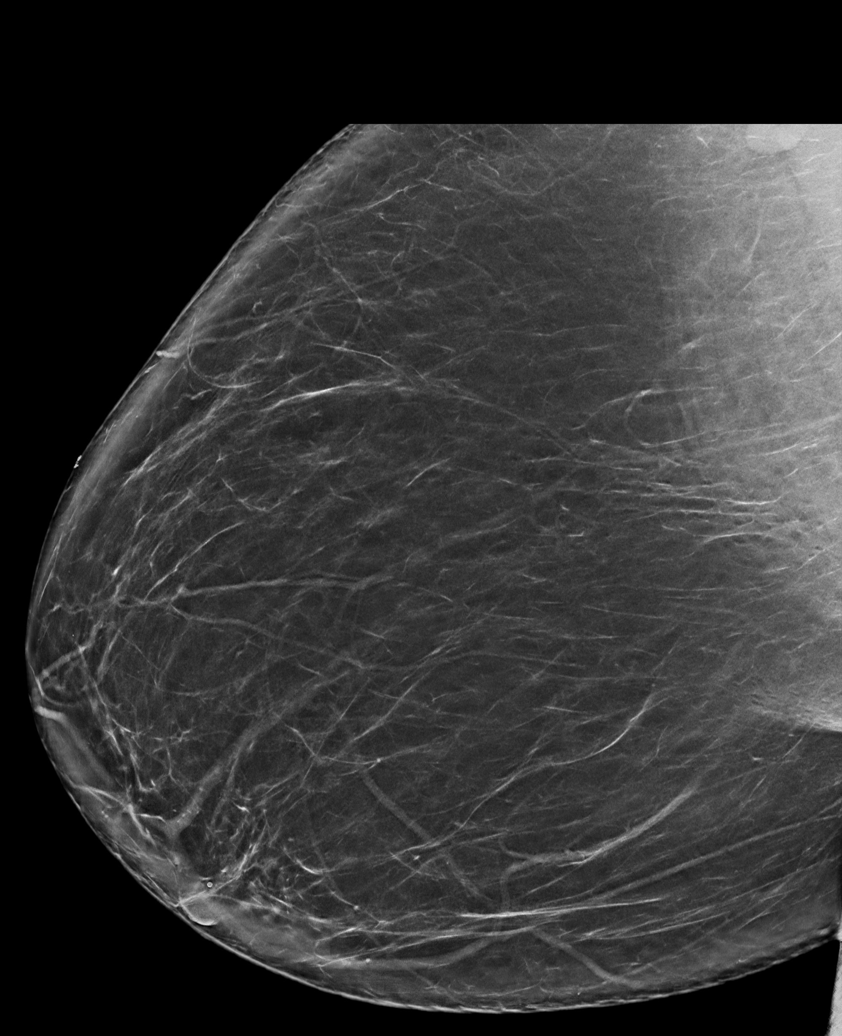

[L MLO synth-2D (1 of 2)]
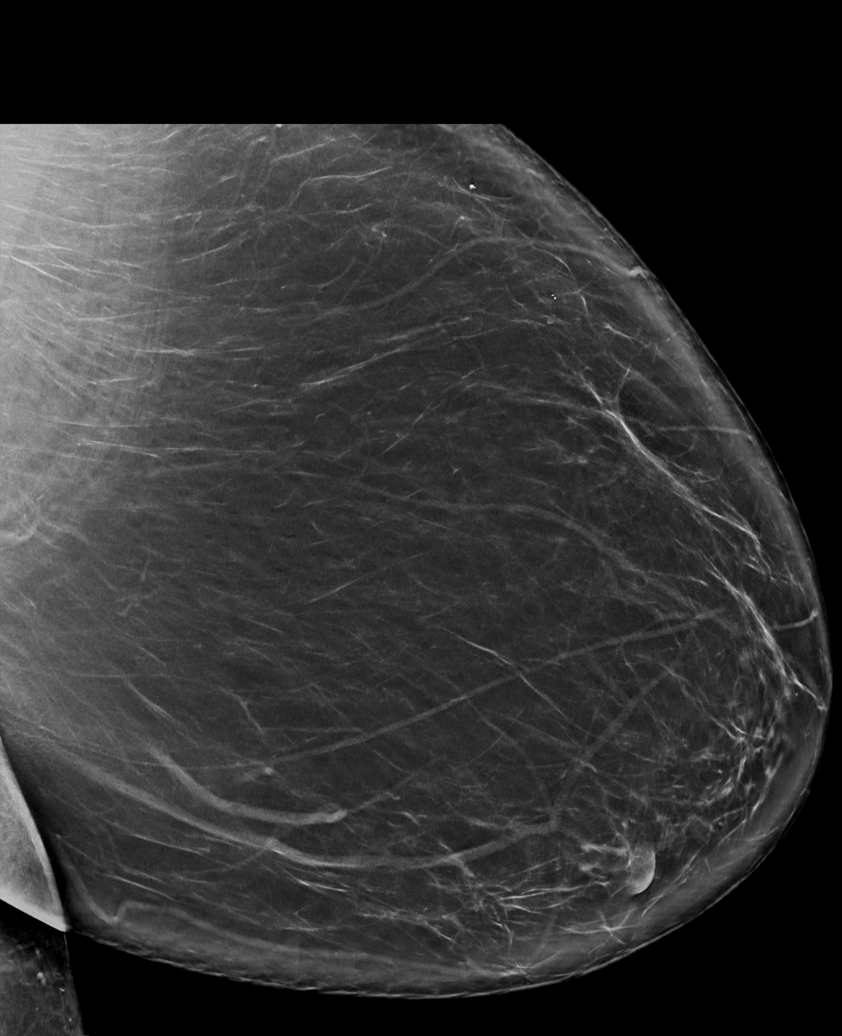

[L CC synth-2D]
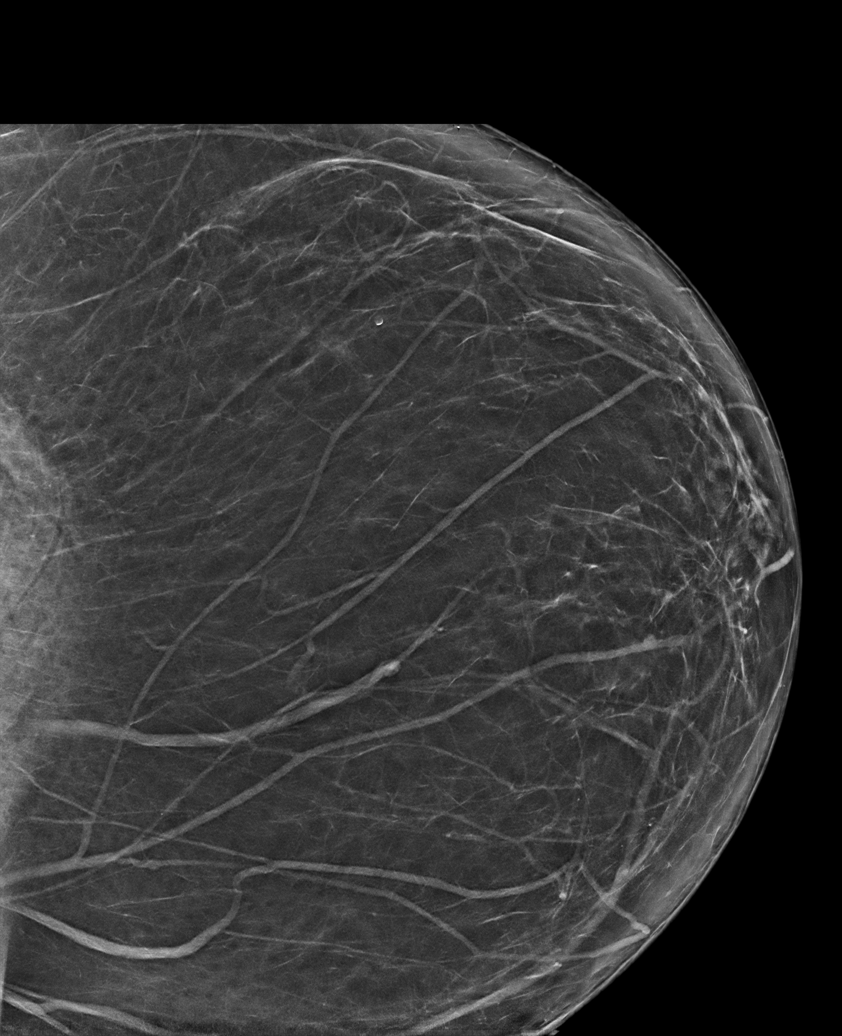

[L MLO synth-2D (2 of 2)]
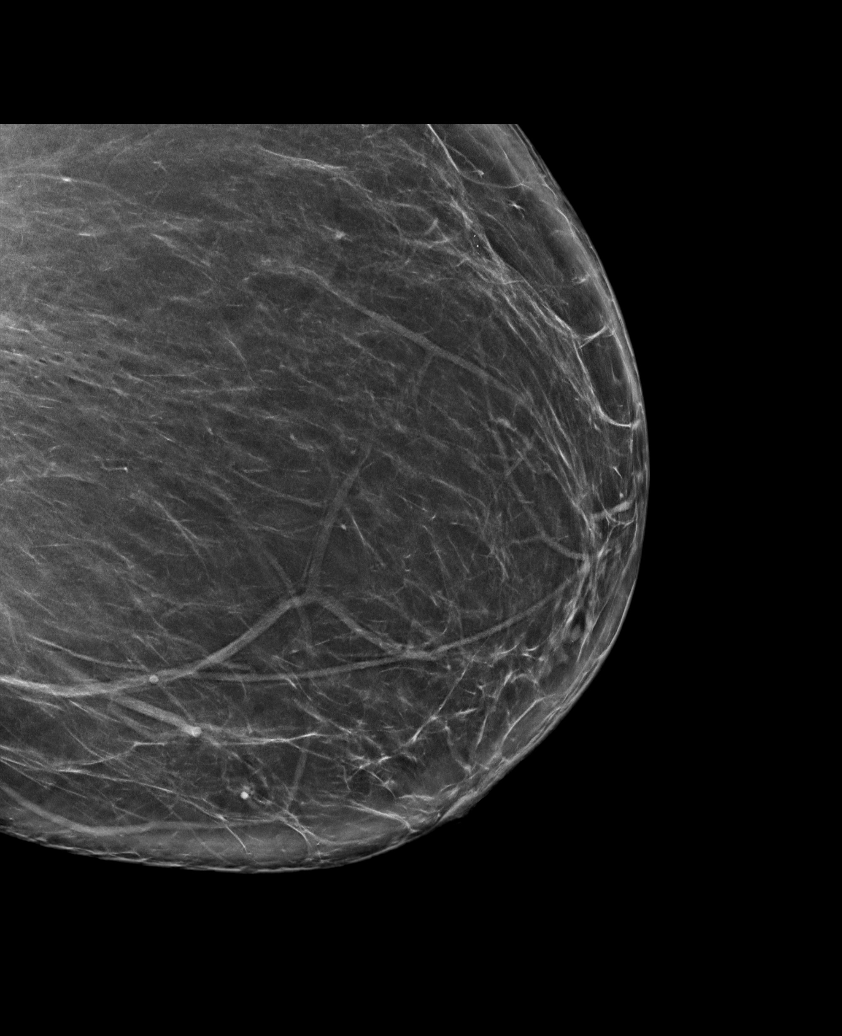

[6 of 36 positions shown; findings below may reference images not displayed]

ACR Breast Density Category b: There are scattered areas of
fibroglandular density.
FINDINGS: There are no findings suspicious for malignancy.
IMPRESSION: No mammographic evidence of malignancy. A result letter of this
screening mammogram will be mailed directly to the patient.

RECOMMENDATION:
Screening mammogram in one year. (Code:[BY])

BI-RADS CATEGORY  1: Negative.

## 2021-04-15 ENCOUNTER — Other Ambulatory Visit: Payer: Self-pay | Admitting: Neurological Surgery

## 2021-04-15 DIAGNOSIS — M5416 Radiculopathy, lumbar region: Secondary | ICD-10-CM

## 2021-05-06 ENCOUNTER — Other Ambulatory Visit: Payer: Self-pay

## 2021-05-06 ENCOUNTER — Ambulatory Visit
Admission: RE | Admit: 2021-05-06 | Discharge: 2021-05-06 | Disposition: A | Discharge status: Home / Self Care | Payer: Medicaid Other | Source: Ambulatory Visit | Attending: Neurological Surgery | Admitting: Neurological Surgery

## 2021-05-06 DIAGNOSIS — M5416 Radiculopathy, lumbar region: Secondary | ICD-10-CM

## 2021-05-06 IMAGING — MR MR LUMBAR SPINE WO/W CM
4 of 7 series · 27 of 48 positions shown · IV contrast (20 ml multihance)
Comparison: [DATE]

CLINICAL DATA: Pain in left leg and low back

EXAM:
MRI LUMBAR SPINE WITHOUT AND WITH CONTRAST
TECHNIQUE: Multiplanar and multiecho pulse sequences of the lumbar spine were
obtained without and with intravenous contrast.
CONTRAST:  20mL MULTIHANCE GADOBENATE DIMEGLUMINE 529 MG/ML IV SOLN

[Series 3: T2 · sagittal · 4.0mm · 0.53mm/px · 3 of 13 slices shown (1 of 2)]
[im 1/13]
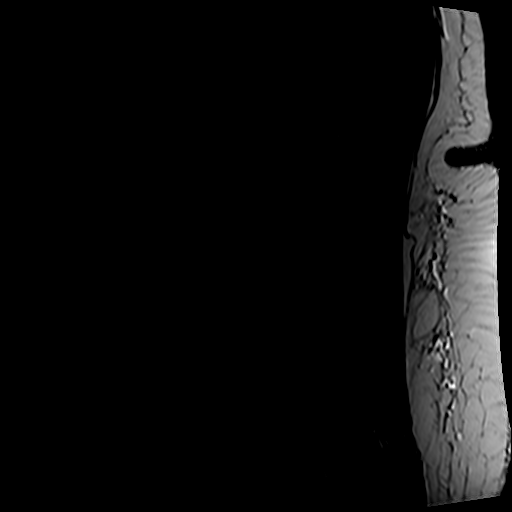
[im 7/13]
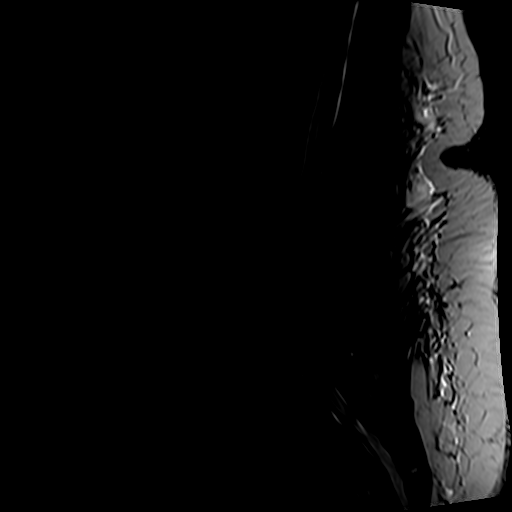
[im 13/13]
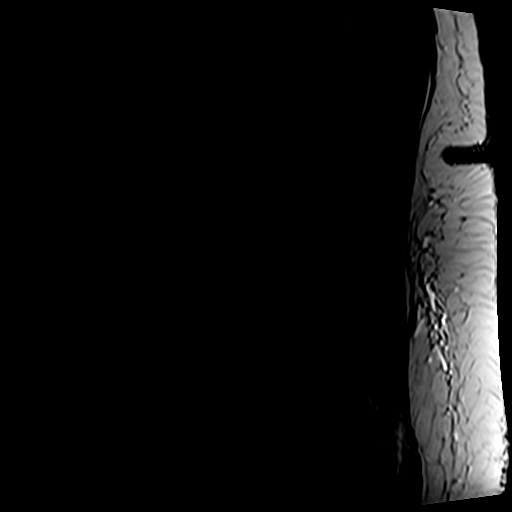

[Series 5: T1 · sagittal · 4.0mm · 0.53mm/px · 4 of 13 slices shown (1 of 2)]
[im 1/13]
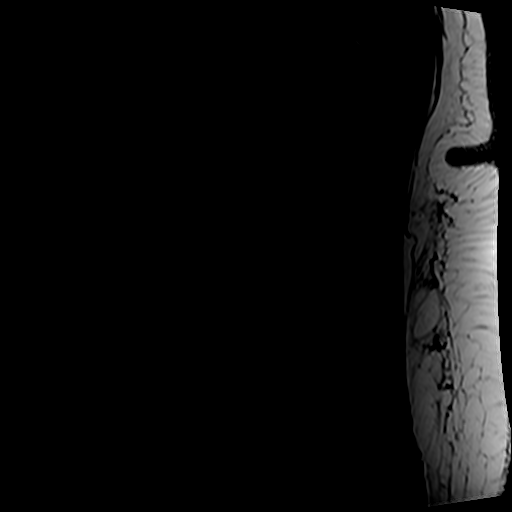
[im 5/13]
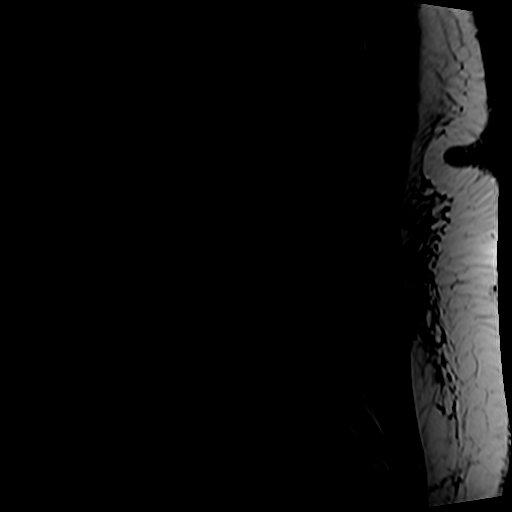
[im 9/13]
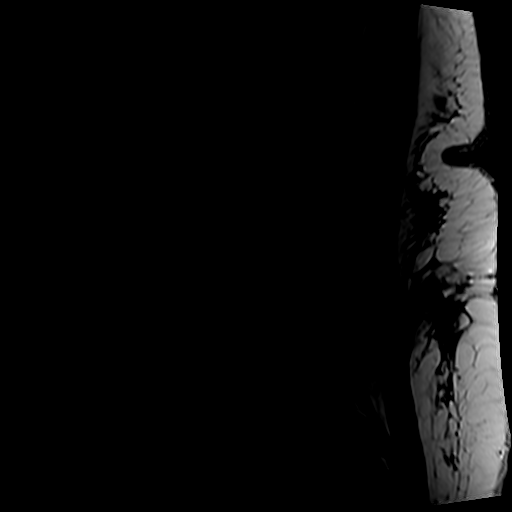
[im 13/13]
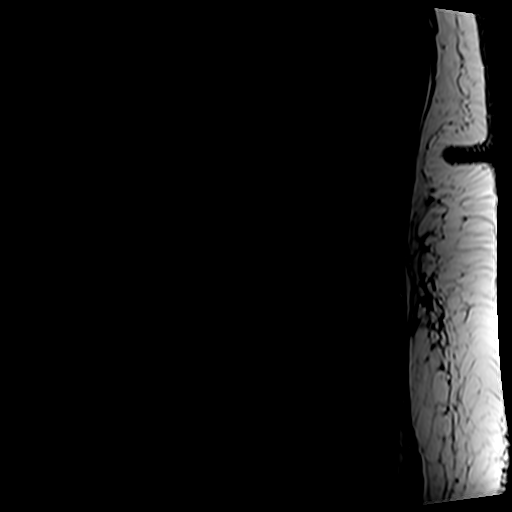

[Series 6: T2 · axial · 4.0mm · 0.70mm/px · z∈[-49,+149]mm · 11 of 35 slices shown (2 of 2)]
[im 1/35]
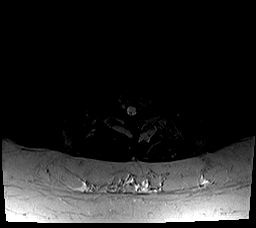
[im 4/35]
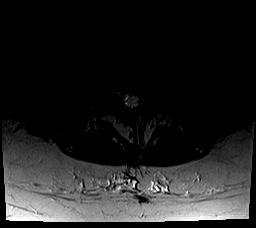
[im 7/35]
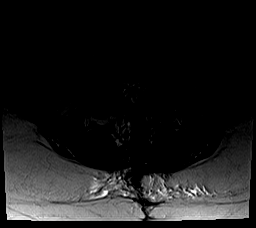
[im 11/35]
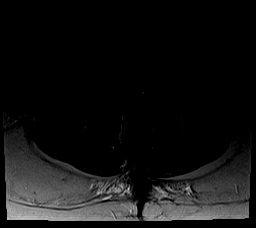
[im 14/35]
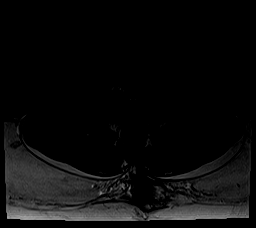
[im 18/35]
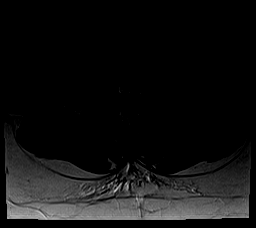
[im 21/35]
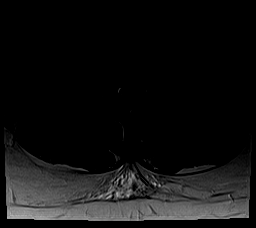
[im 24/35]
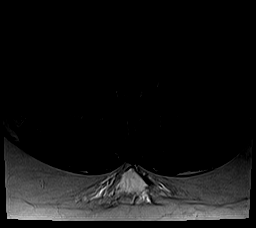
[im 28/35]
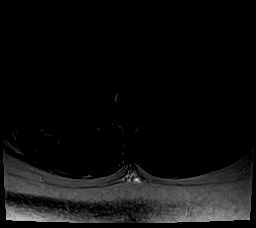
[im 31/35]
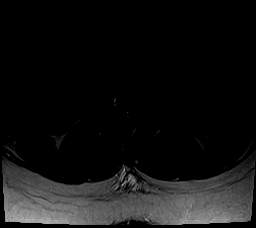
[im 35/35]
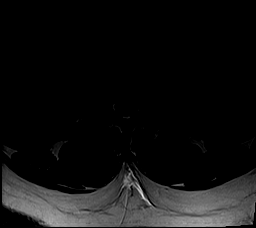

[Series 7: T1 · axial · 4.0mm · 0.35mm/px · z∈[-49,+128]mm · 9 of 35 slices shown (2 of 2)]
[im 1/35]
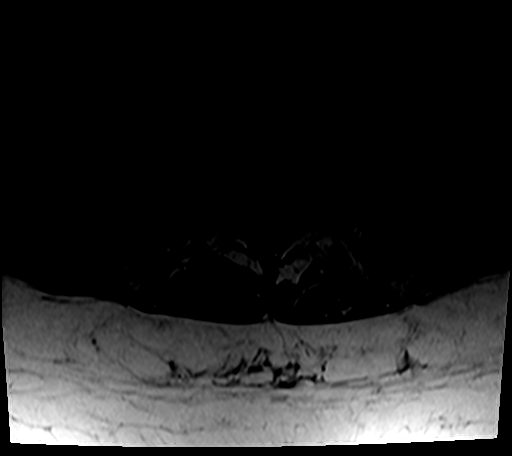
[im 4/35]
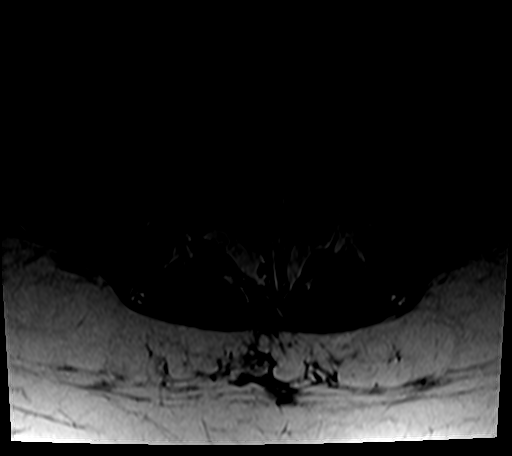
[im 7/35]
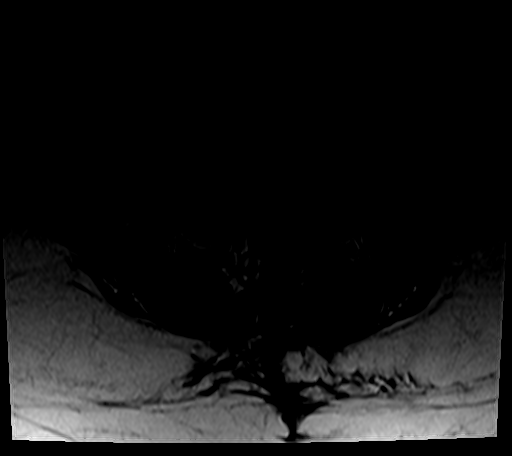
[im 11/35]
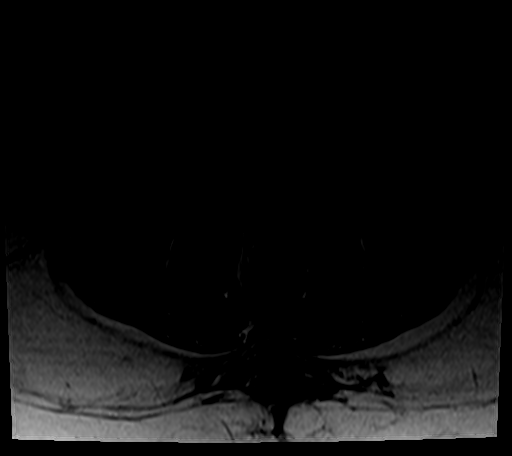
[im 14/35]
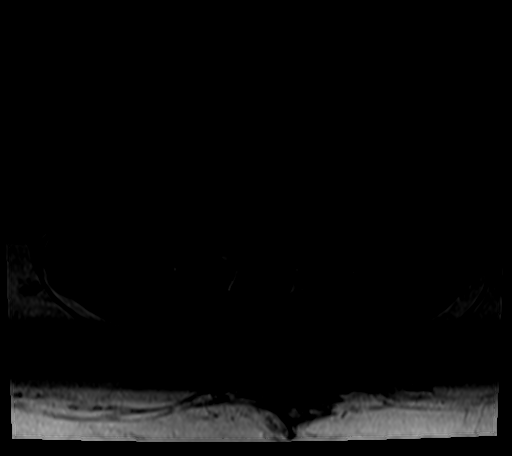
[im 18/35]
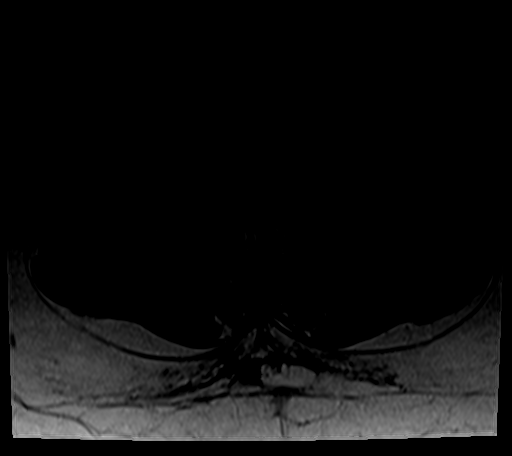
[im 21/35]
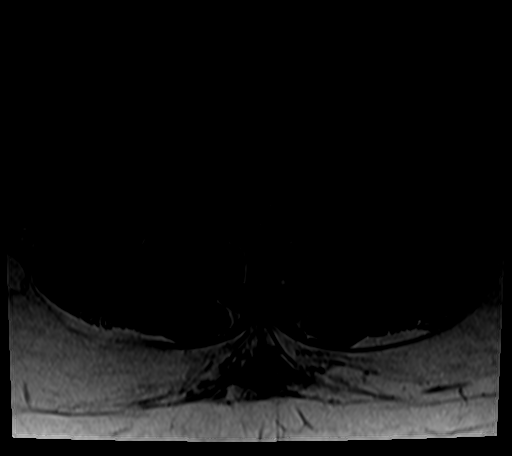
[im 24/35]
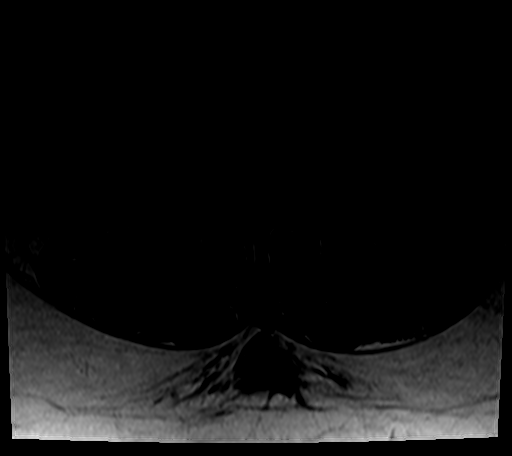
[im 31/35]
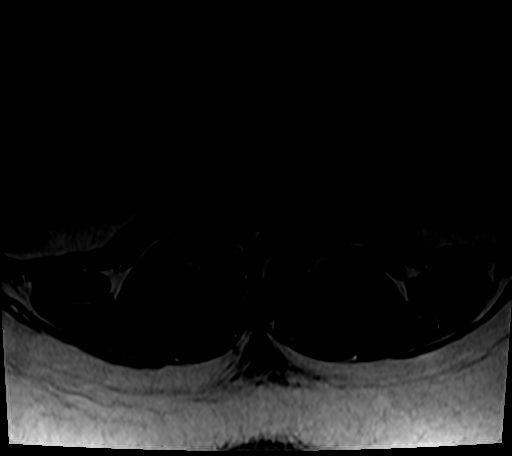

[27 of 48 positions shown; findings below may reference images not displayed]

FINDINGS: Segmentation:  Standard.

Alignment:  Physiologic.

Vertebrae: Status post interval L4-L5 left laminectomy and
discectomy. No acute fracture or suspicious osseous lesion.

Conus medullaris and cauda equina: Conus extends to the L1 level.
Conus and cauda equina appear normal.

Paraspinal and other soft tissues: Postsurgical changes, increased
T2 signal, and enhancement in the soft tissues of the back, likely
postsurgical scarring and edema.

Disc levels:

T12-L1: No significant disc bulge. No spinal canal stenosis or
neural foraminal narrowing.

L1-L2: No significant disc bulge. No spinal canal stenosis or neural
foraminal narrowing.

L2-L3: No significant disc bulge. No spinal canal stenosis or neural
foraminal narrowing.

L3-L4: Small disc bulge with left foraminal protrusion. Mild facet
arthropathy. No spinal canal stenosis. Mild left neural foraminal
narrowing.

L4-L5: Redemonstrated disc bulge with left central/subarticular
protrusion, with interval increase in the size of a more superior
aspect of the bulge (series 3, image 9), which may represent scar
tissue and which extends into the left neural foramen. In addition
there are postsurgical changes with scarring along the medial aspect
of the disc bulge. Moderate spinal canal stenosis. Effacement of the
left lateral recess. Severe left neural foraminal narrowing.

L5-S1: No significant disc bulge. No spinal canal stenosis or neural
foraminal narrowing.
IMPRESSION: Status post L4-L5 left laminectomy and discectomy. At the medial
aspect of the previously noted disc bulge, there are postsurgical
changes with enhancing scar, with overall unchanged moderate spinal
canal stenosis. In addition there is an increase in the size of the
more superior aspect of bulge, possibly additional scarring, which
extends into the left neural foramen, causing severe left neural
foraminal narrowing. Persistent effacement of the left lateral
recess which likely compresses the descending L5 nerve.

## 2021-05-06 MED ORDER — GADOBENATE DIMEGLUMINE 529 MG/ML IV SOLN
20.0000 mL | Freq: Once | INTRAVENOUS | Status: AC | PRN
  Administered 2021-05-06: 20 mL via INTRAVENOUS

## 2021-05-14 ENCOUNTER — Ambulatory Visit: Payer: Self-pay | Admitting: Internal Medicine

## 2022-03-05 ENCOUNTER — Other Ambulatory Visit: Payer: Self-pay | Admitting: Physician Assistant

## 2022-03-05 DIAGNOSIS — Z1231 Encounter for screening mammogram for malignant neoplasm of breast: Secondary | ICD-10-CM

## 2022-04-01 ENCOUNTER — Ambulatory Visit
Admission: RE | Admit: 2022-04-01 | Discharge: 2022-04-01 | Disposition: A | Discharge status: Home / Self Care | Payer: Medicaid Other | Source: Ambulatory Visit | Attending: Physician Assistant | Admitting: Physician Assistant

## 2022-04-01 DIAGNOSIS — Z1231 Encounter for screening mammogram for malignant neoplasm of breast: Secondary | ICD-10-CM

## 2022-12-03 ENCOUNTER — Other Ambulatory Visit: Payer: Self-pay | Admitting: Student

## 2022-12-03 DIAGNOSIS — M5412 Radiculopathy, cervical region: Secondary | ICD-10-CM

## 2022-12-24 ENCOUNTER — Ambulatory Visit
Admission: RE | Admit: 2022-12-24 | Discharge: 2022-12-24 | Disposition: A | Discharge status: Home / Self Care | Payer: Medicaid Other | Source: Ambulatory Visit | Attending: Student | Admitting: Student

## 2022-12-24 DIAGNOSIS — M5412 Radiculopathy, cervical region: Secondary | ICD-10-CM

## 2023-03-01 ENCOUNTER — Other Ambulatory Visit: Payer: Self-pay | Admitting: Physician Assistant

## 2023-03-01 DIAGNOSIS — Z1231 Encounter for screening mammogram for malignant neoplasm of breast: Secondary | ICD-10-CM

## 2023-04-04 ENCOUNTER — Ambulatory Visit: Payer: Medicaid Other

## 2023-04-25 ENCOUNTER — Ambulatory Visit
Admission: RE | Admit: 2023-04-25 | Discharge: 2023-04-25 | Disposition: A | Discharge status: Home / Self Care | Payer: Medicaid Other | Source: Ambulatory Visit | Attending: Physician Assistant | Admitting: Physician Assistant

## 2023-04-25 DIAGNOSIS — Z1231 Encounter for screening mammogram for malignant neoplasm of breast: Secondary | ICD-10-CM

## 2024-04-17 ENCOUNTER — Other Ambulatory Visit: Payer: Self-pay | Admitting: Physician Assistant

## 2024-04-17 DIAGNOSIS — Z1231 Encounter for screening mammogram for malignant neoplasm of breast: Secondary | ICD-10-CM

## 2024-05-01 ENCOUNTER — Ambulatory Visit
Admission: RE | Admit: 2024-05-01 | Discharge: 2024-05-01 | Disposition: A | Discharge status: Home / Self Care | Source: Ambulatory Visit | Attending: Physician Assistant | Admitting: Physician Assistant

## 2024-05-01 DIAGNOSIS — Z1231 Encounter for screening mammogram for malignant neoplasm of breast: Secondary | ICD-10-CM
